# Patient Record
Sex: Female | Born: 1950 | Race: Black or African American | Hispanic: No | Marital: Single | Smoking: Current every day smoker
Health system: Southern US, Community
[De-identification: ages and names within clinical notes are randomized; demographics above are authoritative.]

## PROBLEM LIST (undated history)

## (undated) DIAGNOSIS — I10 Essential (primary) hypertension: Secondary | ICD-10-CM

## (undated) DIAGNOSIS — E785 Hyperlipidemia, unspecified: Secondary | ICD-10-CM

## (undated) HISTORY — PX: NO PAST SURGERIES: SHX2092

## (undated) HISTORY — DX: Hyperlipidemia, unspecified: E78.5

## (undated) HISTORY — DX: Essential (primary) hypertension: I10

---

## 2005-10-21 ENCOUNTER — Emergency Department: Payer: Self-pay | Admitting: Emergency Medicine

## 2006-06-07 ENCOUNTER — Emergency Department: Payer: Self-pay | Admitting: Emergency Medicine

## 2009-01-03 ENCOUNTER — Ambulatory Visit: Payer: Self-pay | Admitting: Gastroenterology

## 2019-05-23 ENCOUNTER — Encounter: Payer: Self-pay | Admitting: Family Medicine

## 2019-05-23 ENCOUNTER — Ambulatory Visit (INDEPENDENT_AMBULATORY_CARE_PROVIDER_SITE_OTHER): Payer: Medicare Other | Admitting: Family Medicine

## 2019-05-23 ENCOUNTER — Other Ambulatory Visit: Payer: Self-pay

## 2019-05-23 VITALS — BP 178/89 | HR 73 | Temp 96.9°F | Resp 18 | Ht 60.5 in | Wt 160.6 lb

## 2019-05-23 DIAGNOSIS — I10 Essential (primary) hypertension: Secondary | ICD-10-CM | POA: Insufficient documentation

## 2019-05-23 DIAGNOSIS — Z0000 Encounter for general adult medical examination without abnormal findings: Secondary | ICD-10-CM | POA: Diagnosis not present

## 2019-05-23 DIAGNOSIS — Z13820 Encounter for screening for osteoporosis: Secondary | ICD-10-CM

## 2019-05-23 DIAGNOSIS — Z7689 Persons encountering health services in other specified circumstances: Secondary | ICD-10-CM

## 2019-05-23 DIAGNOSIS — Z1239 Encounter for other screening for malignant neoplasm of breast: Secondary | ICD-10-CM | POA: Insufficient documentation

## 2019-05-23 DIAGNOSIS — Z1231 Encounter for screening mammogram for malignant neoplasm of breast: Secondary | ICD-10-CM | POA: Diagnosis not present

## 2019-05-23 DIAGNOSIS — R0683 Snoring: Secondary | ICD-10-CM

## 2019-05-23 DIAGNOSIS — M79671 Pain in right foot: Secondary | ICD-10-CM

## 2019-05-23 DIAGNOSIS — Z1211 Encounter for screening for malignant neoplasm of colon: Secondary | ICD-10-CM

## 2019-05-23 DIAGNOSIS — Z720 Tobacco use: Secondary | ICD-10-CM

## 2019-05-23 DIAGNOSIS — Z122 Encounter for screening for malignant neoplasm of respiratory organs: Secondary | ICD-10-CM

## 2019-05-23 LAB — POCT URINALYSIS DIPSTICK
Bilirubin, UA: NEGATIVE
Blood, UA: NEGATIVE
Glucose, UA: NEGATIVE
Ketones, UA: NEGATIVE
Leukocytes, UA: NEGATIVE
Nitrite, UA: NEGATIVE
Protein, UA: NEGATIVE
Spec Grav, UA: >=1.03 — AB (ref 1.010–1.025)
Urobilinogen, UA: 0.2 E.U./dL
pH, UA: 5 (ref 5.0–8.0)

## 2019-05-23 MED ORDER — DICLOFENAC SODIUM 1 % EX GEL: 2.0000 g | Freq: Four times a day (QID) | CUTANEOUS | 1 refills | Status: AC

## 2019-05-23 MED ORDER — BLOOD PRESSURE KIT DEVI: 1.0000 [IU] | Freq: Two times a day (BID) | 0 refills | Status: AC

## 2019-05-23 MED ORDER — LOSARTAN POTASSIUM 25 MG PO TABS: 25.0000 mg | ORAL_TABLET | Freq: Every day | ORAL | 0 refills | Status: DC

## 2019-05-23 NOTE — Assessment & Plan Note (Signed)
Reports smoking history 10-20 cigarettes per day for >35 years.  No desire to discuss cessation today.

## 2019-05-23 NOTE — Progress Notes (Addendum)
Subjective:    Patient ID: Darlene Mcdaniel, female    DOB: 1951-01-30, 69 y.o.   MRN: [id number]  Darlene Mcdaniel is a 69 y.o. female presenting on 05/23/2019 for Establish Care (Swollen Rt foot w/ intermittent sharp shooting pain. Not related to any injury that she recalls. No improvement with incline or worsening swelling with prolong standing.   x 2 week) and Hypertension (elevated bp reading at her dentist appt x 10 days ago)   HPI  HEALTH MAINTENANCE:  Weight/BMI: Obese with BMI 30.85 Physical activity: No structured exercise routine Diet: Regular Seatbelt: Yes Sunscreen: Sometimes Mammogram: Due - ordered today PAP: Due - Will complete at follow up visit in 2 weeks DEXA: Due - ordered today Colon cancer screening: Due - ordered Cologuard today HIV & Hep C Screening: Declines Optometry: Regularly Dentistry: Regularly  IMMUNIZATIONS: Influenza: Due next season Tetanus: Unknown, DUE COVID: Completed series, completed with [facility] in [place], [place]  STOP BANG SCREENING Snoring: Do you snore loudly? yes/no: Yes Tired: Do you often feel tired, fatigued, sleeping during the daytime? yes/no: Yes Observed: Has anyone observed you stop breathing or choking/gasping during your sleep? yes/no: No Pressure: Do you have or being treated for high blood pressure? yes/no: Yes BMI: Greater than 35? yes/no: No Age: Older than 50? yes/no: Yes Neck Side: Greater than 17 (males)/16 (females)? yes/no: No Gender: Born as female gender? yes/no: No  Depression screen [facility] 2/9 05/23/2019  Decreased Interest 0  Down, Depressed, Hopeless 0  PHQ - 2 Score 0    History reviewed. No pertinent past medical history. History reviewed. No pertinent surgical history. Social History   Socioeconomic History  . Marital status: Single    Spouse name: Not on file  . Number of children: Not on file  . Years of education: Not on file  . Highest education level: Not on file  Occupational History  .  Not on file  Tobacco Use  . Smoking status: Current Every Day Smoker    Packs/day: 0.75    Years: 35.00    Pack years: 26.25    Types: Cigarettes  . Smokeless tobacco: Never Used  Substance and Sexual Activity  . Alcohol use: Yes    Alcohol/week: 3.0 standard drinks    Types: 3 Cans of beer per week    Comment: 3-5  - only on the weekend  . Drug use: Never  . Sexual activity: Not on file  Other Topics Concern  . Not on file  Social History Narrative  . Not on file   Social Determinants of Health   Financial Resource Strain:   . Difficulty of Paying Living Expenses:   Food Insecurity:   . Worried About Charity fundraiser in the Last Year:   . Arboriculturist in the Last Year:   Transportation Needs:   . Film/video editor (Medical):   Marland Kitchen Lack of Transportation (Non-Medical):   Physical Activity:   . Days of Exercise per Week:   . Minutes of Exercise per Session:   Stress:   . Feeling of Stress :   Social Connections:   . Frequency of Communication with Friends and Family:   . Frequency of Social Gatherings with Friends and Family:   . Attends Religious Services:   . Active Member of Clubs or Organizations:   . Attends Archivist Meetings:   Marland Kitchen Marital Status:   Intimate Partner Violence:   . Fear of Current or Ex-Partner:   .  Emotionally Abused:   Marland Kitchen Physically Abused:   . Sexually Abused:    Family History  Problem Relation Age of Onset  . Heart disease Mother   . Stroke Mother   . Diabetes Mother   . Cancer Mother   . Leukemia Mother   . Breast cancer Sister    No current outpatient medications on file prior to visit.   No current facility-administered medications on file prior to visit.    Per HPI unless specifically indicated above     Objective:    BP (!) 178/89 (BP Location: Right Arm, Patient Position: Sitting, Cuff Size: Normal)   Pulse 73   Temp (!) 96.9 F (36.1 C) (Temporal)   Resp 18   Ht 5' 0.5" (1.537 m)   Wt 160 lb 9.6  oz (72.8 kg)   SpO2 98%   BMI 30.85 kg/m   Wt Readings from Last 3 Encounters:  06/07/19 162 lb 3.2 oz (73.6 kg)  05/23/19 160 lb 9.6 oz (72.8 kg)   Physical Exam Vitals reviewed.  Constitutional:      General: She is not in acute distress.    Appearance: Normal appearance. She is well-developed, well-groomed and overweight. She is not ill-appearing or toxic-appearing.  HENT:     Head: Normocephalic.     Right Ear: Tympanic membrane, ear canal and external ear normal. There is no impacted cerumen.     Left Ear: Tympanic membrane, ear canal and external ear normal. There is no impacted cerumen.     Nose: Nose normal. No congestion or rhinorrhea.     Mouth/Throat:     Lips: Pink.     Mouth: Mucous membranes are moist.     Pharynx: Oropharynx is clear. Uvula midline. No oropharyngeal exudate or posterior oropharyngeal erythema.  Eyes:     General: Lids are normal. Vision grossly intact. No scleral icterus.       Right eye: No discharge.        Left eye: No discharge.     Extraocular Movements: Extraocular movements intact.     Conjunctiva/sclera: Conjunctivae normal.     Pupils: Pupils are equal, round, and reactive to light.  Neck:     Thyroid: No thyroid mass or thyromegaly.  Cardiovascular:     Rate and Rhythm: Normal rate and regular rhythm.     Pulses: Normal pulses.          Dorsalis pedis pulses are 2+ on the right side and 2+ on the left side.     Heart sounds: Normal heart sounds. No murmur. No friction rub. No gallop.   Pulmonary:     Effort: Pulmonary effort is normal. No respiratory distress.     Breath sounds: Normal breath sounds.  Abdominal:     General: Abdomen is flat. Bowel sounds are normal. There is no distension.     Palpations: Abdomen is soft. There is no hepatomegaly, splenomegaly or mass.     Tenderness: There is no abdominal tenderness. There is no guarding or rebound.     Hernia: No hernia is present.  Musculoskeletal:        General: Swelling and  tenderness present. Normal range of motion.     Cervical back: Normal range of motion and neck supple. No tenderness.     Right lower leg: No edema.     Left lower leg: No edema.     Right ankle: Normal.     Left ankle: Swelling present. No deformity, ecchymosis or lacerations. Tenderness  present over the lateral malleolus. Normal range of motion.  Feet:     Right foot:     Skin integrity: Skin integrity normal.     Left foot:     Skin integrity: Skin integrity normal.  Lymphadenopathy:     Cervical: No cervical adenopathy.  Skin:    General: Skin is warm and dry.     Capillary Refill: Capillary refill takes less than 2 seconds.  Neurological:     General: No focal deficit present.     Mental Status: She is alert and oriented to person, place, and time.     Cranial Nerves: No cranial nerve deficit.     Sensory: No sensory deficit.     Motor: No weakness.     Coordination: Coordination normal.     Gait: Gait normal.     Deep Tendon Reflexes: Reflexes normal.  Psychiatric:        Attention and Perception: Attention and perception normal.        Mood and Affect: Mood and affect normal.        Speech: Speech normal.        Behavior: Behavior normal. Behavior is cooperative.        Thought Content: Thought content normal.        Cognition and Memory: Cognition and memory normal.        Judgment: Judgment normal.     Results for orders placed or performed in visit on 05/23/19  CBC with Differential  Result Value Ref Range   WBC 8.6 3.8 - 10.8 Thousand/uL   RBC 4.57 3.80 - 5.10 Million/uL   Hemoglobin 14.3 11.7 - 15.5 g/dL   HCT 43.2 35.0 - 45.0 %   MCV 94.5 80.0 - 100.0 fL   MCH 31.3 27.0 - 33.0 pg   MCHC 33.1 32.0 - 36.0 g/dL   RDW 13.4 11.0 - 15.0 %   Platelets 288 140 - 400 Thousand/uL   MPV 11.3 7.5 - 12.5 fL   Neutro Abs 5,788 1,500 - 7,800 cells/uL   Lymphs Abs 2,133 850 - 3,900 cells/uL   Absolute Monocytes 576 200 - 950 cells/uL   Eosinophils Absolute 52 15 - 500  cells/uL   Basophils Absolute 52 0 - 200 cells/uL   Neutrophils Relative % 67.3 %   Total Lymphocyte 24.8 %   Monocytes Relative 6.7 %   Eosinophils Relative 0.6 %   Basophils Relative 0.6 %  COMPLETE METABOLIC PANEL WITH GFR  Result Value Ref Range   Glucose, Bld 88 65 - 139 mg/dL   BUN 13 7 - 25 mg/dL   Creat 0.55 0.50 - 0.99 mg/dL   GFR, Est Non African American 98 > OR = 60 mL/min/1.40m   GFR, Est African American 113 > OR = 60 mL/min/1.714m  BUN/Creatinine Ratio NOT APPLICABLE 6 - 22 (calc)   Sodium 142 135 - 146 mmol/L   Potassium 4.0 3.5 - 5.3 mmol/L   Chloride 106 98 - 110 mmol/L   CO2 28 20 - 32 mmol/L   Calcium 9.1 8.6 - 10.4 mg/dL   Total Protein 7.3 6.1 - 8.1 g/dL   Albumin 4.1 3.6 - 5.1 g/dL   Globulin 3.2 1.9 - 3.7 g/dL (calc)   AG Ratio 1.3 1.0 - 2.5 (calc)   Total Bilirubin 0.5 0.2 - 1.2 mg/dL   Alkaline phosphatase (APISO) 67 37 - 153 U/L   AST 16 10 - 35 U/L   ALT 18 6 - 29 U/L  Thyroid Panel With TSH  Result Value Ref Range   T3 Uptake 32 22 - 35 %   T4, Total 6.9 5.1 - 11.9 mcg/dL   Free Thyroxine Index 2.2 1.4 - 3.8   TSH 0.57 0.40 - 4.50 mIU/L  POCT Urinalysis Dipstick  Result Value Ref Range   Color, UA Yellow    Clarity, UA Clear    Glucose, UA Negative Negative   Bilirubin, UA Negative    Ketones, UA negative    Spec Grav, UA >=1.030 (A) 1.010 - 1.025   Blood, UA negative    pH, UA 5.0 5.0 - 8.0   Protein, UA Negative Negative   Urobilinogen, UA 0.2 0.2 or 1.0 E.U./dL   Nitrite, UA negative    Leukocytes, UA Negative Negative   Appearance     Odor        Assessment & Plan:   Problem List Items Addressed This Visit      Cardiovascular and Mediastinum   Hypertension - Primary    Uncontrolled hypertension.  BP is not at goal < 130/80.  Pt will begin working on lifestyle modifications.  No known complications.  Without primary care > 10 years.  ED visit 11/15/2016 with similar blood pressure readings.  Discussed importance of obtaining  blood pressure control.  Plan: 1. BEGIN taking Losartan '25mg'$  daily 2. Obtain labs today 3. Encouraged heart healthy diet and increasing exercise to 30 minutes most days of the week, going no more than 2 days in a row without exercise. 4. Check BP 1-2 x per day at home, keep log, and bring to clinic at next appointment. 5. Follow up 2 weeks.      Relevant Medications   Blood Pressure Monitoring (BLOOD PRESSURE KIT) DEVI   Other Relevant Orders   CBC with Differential (Completed)   COMPLETE METABOLIC PANEL WITH GFR (Completed)     Other   Right foot pain    Chronic right foot pain with swelling over lateral malleolus with fluctuance.  No s/s of infectious process and similar findings at ER visit 11/15/2016.  Has not had evaluated with Orthopedic in the past.  Reports unknown if swelling gets better when feet are up.  No left lower extremity swelling.  No redness, warmth, streaking, fever, SOB, CP, or s/s of DVT.    Plan: 1. Wear compression stockings daily, taking off at night for bed 2. Can use topical diclofenac gel 1% daily 3-4x per day as needed for pain 3. Follow up in 2 weeks and if no change in symptoms, will refer to Orthopedics.      Relevant Medications   diclofenac Sodium (VOLTAREN) 1 % GEL   Tobacco abuse    Reports smoking history 10-20 cigarettes per day for >35 years.  No desire to discuss cessation today.      Relevant Orders   Ambulatory Referral for Lung Cancer Scre   Snoring    Patient reports waking up not feeling rested with 4/8 scoring on STOP-BANG Questionnaire.  Reports snores, does wake herself up with her snoring.  Denies any witnessed apneas.  Has not been diagnosed with OSA or had a sleep study in the past.  Plan: 1. Sleep study ordered      Relevant Orders   Nocturnal polysomnography (NPSG)   Encounter to establish care with new doctor    Patient without reported primary care > 10 years.    Labs ordered and to be completed today. Lipid labs to  be completed in  2 weeks at follow up appointment when fasting.  Return to clinic in 2 weeks for hypertension and right foot pain follow up as well as PAP testing.      Screening for malignant neoplasm of colon    Pt requiring colon cancer screening.  No known family history of colon cancer.  Plan: - Discussed timing for initiation of colon cancer screening ACS vs USPSTF guidelines - Mutual decision making discussion for options of colonoscopy vs cologuard.  Pt prefers cologuard. - Ordered Cologuard today       Relevant Orders   Cologuard   Screening for malignant neoplasm of breast    Pt last mammogram >10 years ago.  Result unknown.  No results to review.  Plan: 1. Screening mammogram order placed.  Pt will call to schedule appointment.  Information given.       Relevant Orders   MM 3D SCREEN BREAST BILATERAL   Screening for osteoporosis    Pt postmenopausal w/out history of prior DEXA scan.    Plan: 1. Obtain DG bone density.         Relevant Orders   DG Bone Density   Encounter for screening for lung cancer    Lung cancer screening referral placed for evaluation if candidate for LDCT screening.      Relevant Orders   Ambulatory Referral for Lung Cancer Scre    Other Visit Diagnoses    Routine medical exam       Relevant Orders   CBC with Differential (Completed)   COMPLETE METABOLIC PANEL WITH GFR (Completed)   Thyroid Panel With TSH (Completed)   POCT Urinalysis Dipstick (Completed)      Meds ordered this encounter  Medications  . Blood Pressure Monitoring (BLOOD PRESSURE KIT) DEVI    Sig: 1 Units by Does not apply route 2 (two) times daily.    Dispense:  1 each    Refill:  0  . DISCONTD: losartan (COZAAR) 25 MG tablet    Sig: Take 1 tablet (25 mg total) by mouth daily.    Dispense:  30 tablet    Refill:  0  . diclofenac Sodium (VOLTAREN) 1 % GEL    Sig: Apply 2 g topically 4 (four) times daily.    Dispense:  50 g    Refill:  1      Follow up  plan: Return in about 2 weeks (around 06/06/2019) for HTN, Right foot follow up and PAP testing.  Harlin Rain, FNP-C Family Nurse Practitioner [facility] 06/07/2019, 11:02 AM

## 2019-05-23 NOTE — Assessment & Plan Note (Signed)
Pt requiring colon cancer screening.  No known family history of colon cancer.  Plan: - Discussed timing for initiation of colon cancer screening ACS vs USPSTF guidelines - Mutual decision making discussion for options of colonoscopy vs cologuard.  Pt prefers cologuard. - Ordered Cologuard today

## 2019-05-23 NOTE — Assessment & Plan Note (Signed)
Chronic right foot pain with swelling over lateral malleolus with fluctuance.  No s/s of infectious process and similar findings at ER visit 11/15/2016.  Has not had evaluated with Orthopedic in the past.  Reports unknown if swelling gets better when feet are up.  No left lower extremity swelling.  No redness, warmth, streaking, fever, SOB, CP, or s/s of DVT.    Plan: 1. Wear compression stockings daily, taking off at night for bed 2. Can use topical diclofenac gel 1% daily 3-4x per day as needed for pain 3. Follow up in 2 weeks and if no change in symptoms, will refer to Orthopedics.

## 2019-05-23 NOTE — Assessment & Plan Note (Signed)
Uncontrolled hypertension.  BP is not at goal < 130/80.  Pt will begin working on lifestyle modifications.  No known complications.  Without primary care > 10 years.  ED visit 11/15/2016 with similar blood pressure readings.  Discussed importance of obtaining blood pressure control.  Plan: 1. BEGIN taking Losartan 25mg  daily 2. Obtain labs today 3. Encouraged heart healthy diet and increasing exercise to 30 minutes most days of the week, going no more than 2 days in a row without exercise. 4. Check BP 1-2 x per day at home, keep log, and bring to clinic at next appointment. 5. Follow up 2 weeks.

## 2019-05-23 NOTE — Assessment & Plan Note (Signed)
Pt postmenopausal w/out history of prior DEXA scan.    Plan: 1. Obtain DG bone density.    

## 2019-05-23 NOTE — Assessment & Plan Note (Signed)
Lung cancer screening referral placed for evaluation if candidate for LDCT screening.

## 2019-05-23 NOTE — Patient Instructions (Addendum)
As we discussed, have your labs drawn today and we will contact you with the results.  We will see you back in 2 weeks for a follow up on your right ankle pain and hypertension.  I have sent in a prescription for Losartan 25mg  to take 1 tablet daily.  Be sure to change positions slowly for the next few days as while this medication is lowering your blood pressure you may have some dizziness, lightheadedness with position changes.  Can use topical diclofenac gel 1% on your right foot 3-4x per day as needed for discomfort.  Can wear compression stockings as well to help with the swelling.  If symptoms persist in 2 weeks, will refer you to Orthopedics for additional evaluation and treatment.  Try to get exercise a minimum of 30 minutes per day at least 5 days per week as well as  adequate water intake all while measuring blood pressure a few times per week.  Keep a blood pressure log and bring back to clinic at your next visit.  If your readings are consistently over 140/90 to contact our office/send me a MyChart message and we will see you sooner.  Can try DASH and Mediterranean diet options, avoiding processed foods, lowering sodium intake, avoiding pork products, and eating a plant based diet for optimal health.  Education and discussion with patient regarding hypertension as well as the effects on the organs and body.  Specifically, we spoke about kidney disease, kidney failure, heart attack, stroke and up to and including death, as likely outcomes if non-compliant with blood pressure regulation.  Discussed how all of these habits are attached to each other and each has the effect on each other.      Mediterranean Diet  Why follow it? Research shows. . Those who follow the Mediterranean diet have a reduced risk of heart disease  . The diet is associated with a reduced incidence of Parkinson's and Alzheimer's diseases . People following the diet may have longer life expectancies and lower rates of  chronic diseases  . The Dietary Guidelines for Americans recommends the Mediterranean diet as an eating plan to promote health and prevent disease  What Is the Mediterranean Diet?  . Healthy eating plan based on typical foods and recipes of Mediterranean-style cooking . The diet is primarily a plant based diet; these foods should make up a majority of meals   Starches - Plant based foods should make up a majority of meals - They are an important sources of vitamins, minerals, energy, antioxidants, and fiber - Choose whole grains, foods high in fiber and minimally processed items  - Typical grain sources include wheat, oats, barley, corn, brown rice, bulgar, farro, millet, polenta, couscous  - Various types of beans include chickpeas, lentils, fava beans, black beans, white beans   Fruits  Veggies - Large quantities of antioxidant rich fruits & veggies; 6 or more servings  - Vegetables can be eaten raw or lightly drizzled with oil and cooked  - Vegetables common to the traditional Mediterranean Diet include: artichokes, arugula, beets, broccoli, brussel sprouts, cabbage, carrots, celery, collard greens, cucumbers, eggplant, kale, leeks, lemons, lettuce, mushrooms, okra, onions, peas, peppers, potatoes, pumpkin, radishes, rutabaga, shallots, spinach, sweet potatoes, turnips, zucchini - Fruits common to the Mediterranean Diet include: apples, apricots, avocados, cherries, clementines, dates, figs, grapefruits, grapes, melons, nectarines, oranges, peaches, pears, pomegranates, strawberries, tangerines  Fats - Replace butter and margarine with healthy oils, such as olive oil, canola oil, and tahini  - Limit  nuts to no more than a handful a day  - Nuts include walnuts, almonds, pecans, pistachios, pine nuts  - Limit or avoid candied, honey roasted or heavily salted nuts - Olives are central to the Mediterranean diet - can be eaten whole or used in a variety of dishes   Meats Protein - Limiting red  meat: no more than a few times a month - When eating red meat: choose lean cuts and keep the portion to the size of deck of cards - Eggs: approx. 0 to 4 times a week  - Fish and lean poultry: at least 2 a week  - Healthy protein sources include, chicken, [place], lean beef, lamb - Increase intake of seafood such as tuna, salmon, trout, mackerel, shrimp, scallops - Avoid or limit high fat processed meats such as sausage and bacon  Dairy - Include moderate amounts of low fat dairy products  - Focus on healthy dairy such as fat free yogurt, skim milk, low or reduced fat cheese - Limit dairy products higher in fat such as whole or 2% milk, cheese, ice cream  Alcohol - Moderate amounts of red wine is ok  - No more than 5 oz daily for women (all ages) and men older than age 31  - No more than 10 oz of wine daily for men younger than 75  Other - Limit sweets and other desserts  - Use herbs and spices instead of salt to flavor foods  - Herbs and spices common to the traditional Mediterranean Diet include: basil, bay leaves, chives, cloves, cumin, fennel, garlic, lavender, marjoram, mint, oregano, parsley, pepper, rosemary, sage, savory, sumac, tarragon, thyme   It's not just a diet, it's a lifestyle:  . The Mediterranean diet includes lifestyle factors typical of those in the region  . Foods, drinks and meals are best eaten with others and savored . Daily physical activity is important for overall good health . This could be strenuous exercise like running and aerobics . This could also be more leisurely activities such as walking, housework, yard-work, or taking the stairs . Moderation is the key; a balanced and healthy diet accommodates most foods and drinks . Consider portion sizes and frequency of consumption of certain foods   Meal Ideas & Options:  . Breakfast:  o Whole wheat toast or whole wheat English muffins with peanut butter & hard boiled egg o Steel cut oats topped with apples &  cinnamon and skim milk  o Fresh fruit: banana, strawberries, melon, berries, peaches  o Smoothies: strawberries, bananas, greek yogurt, peanut butter o Low fat greek yogurt with blueberries and granola  o Egg white omelet with spinach and mushrooms o Breakfast couscous: whole wheat couscous, apricots, skim milk, cranberries  . Sandwiches:  o Hummus and grilled vegetables (peppers, zucchini, squash) on whole wheat bread   o Grilled chicken on whole wheat pita with lettuce, tomatoes, cucumbers or tzatziki  o Tuna salad on whole wheat bread: tuna salad made with greek yogurt, olives, red peppers, capers, green onions o Garlic rosemary lamb pita: lamb sauted with garlic, rosemary, salt & pepper; add lettuce, cucumber, greek yogurt to pita - flavor with lemon juice and black pepper  . Seafood:  o Mediterranean grilled salmon, seasoned with garlic, basil, parsley, lemon juice and black pepper o Shrimp, lemon, and spinach whole-grain pasta salad made with low fat greek yogurt  o Seared scallops with lemon orzo  o Seared tuna steaks seasoned salt, pepper, coriander topped with tomato mixture  of olives, tomatoes, olive oil, minced garlic, parsley, green onions and cappers  . Meats:  o Herbed greek chicken salad with kalamata olives, cucumber, feta  o Red bell peppers stuffed with spinach, bulgur, lean ground beef (or lentils) & topped with feta   o Kebabs: skewers of chicken, tomatoes, onions, zucchini, squash  o [place] burgers: made with red onions, mint, dill, lemon juice, feta cheese topped with roasted red peppers . Vegetarian o Cucumber salad: cucumbers, artichoke hearts, celery, red onion, feta cheese, tossed in olive oil & lemon juice  o Hummus and whole grain pita points with a greek salad (lettuce, tomato, feta, olives, cucumbers, red onion) o Lentil soup with celery, carrots made with vegetable broth, garlic, salt and pepper  o Tabouli salad: parsley, bulgur, mint, scallions, cucumbers,  tomato, radishes, lemon juice, olive oil, salt and pepper.  For Mammogram screening for breast cancer and DEXA Scan (Bone mineral density) screening for osteoporosis  Call the Imaging Center below anytime to schedule your own appointment now that order has been placed.  [facility] [address] [place], [place] [address] Phone: [phone]  [facility] [address] [place], [place] [address] Phone: [phone]  I have sent in a referral for a sleep study and for your Cologuard testing.  You will receive a survey after today's visit either digitally by e-mail or paper by [employer]. Your experiences and feedback matter to [place].  Please respond so we know how we are doing as we provide care for you.  Call us with any questions/concerns/needs.  It is my goal to be available to you for your health concerns.  Thanks for choosing me to be a partner in your healthcare needs!  Charlaine Dalton, FNP-C Family Nurse Practitioner [facility] Phone: [phone]

## 2019-05-23 NOTE — Assessment & Plan Note (Signed)
Patient reports waking up not feeling rested with 4/8 scoring on STOP-BANG Questionnaire.  Reports snores, does wake herself up with her snoring.  Denies any witnessed apneas.  Has not been diagnosed with OSA or had a sleep study in the past.  Plan: 1. Sleep study ordered

## 2019-05-23 NOTE — Assessment & Plan Note (Signed)
Patient without reported primary care > 10 years.    Labs ordered and to be completed today. Lipid labs to be completed in 2 weeks at follow up appointment when fasting.  Return to clinic in 2 weeks for hypertension and right foot pain follow up as well as PAP testing.

## 2019-05-23 NOTE — Assessment & Plan Note (Signed)
Pt last mammogram >10 years ago.  Result unknown.  No results to review.  Plan: 1. Screening mammogram order placed.  Pt will call to schedule appointment.  Information given.

## 2019-05-24 LAB — COMPLETE METABOLIC PANEL WITH GFR
AG Ratio: 1.3 (calc) (ref 1.0–2.5)
ALT: 18 U/L (ref 6–29)
AST: 16 U/L (ref 10–35)
Albumin: 4.1 g/dL (ref 3.6–5.1)
Alkaline phosphatase (APISO): 67 U/L (ref 37–153)
BUN: 13 mg/dL (ref 7–25)
CO2: 28 mmol/L (ref 20–32)
Calcium: 9.1 mg/dL (ref 8.6–10.4)
Chloride: 106 mmol/L (ref 98–110)
Creat: 0.55 mg/dL (ref 0.50–0.99)
GFR, Est African American: 113 mL/min/{1.73_m2} (ref >=60)
GFR, Est Non African American: 98 mL/min/{1.73_m2} (ref >=60)
Globulin: 3.2 g/dL (calc) (ref 1.9–3.7)
Glucose, Bld: 88 mg/dL (ref 65–139)
Potassium: 4 mmol/L (ref 3.5–5.3)
Sodium: 142 mmol/L (ref 135–146)
Total Bilirubin: 0.5 mg/dL (ref 0.2–1.2)
Total Protein: 7.3 g/dL (ref 6.1–8.1)

## 2019-05-24 LAB — CBC WITH DIFFERENTIAL/PLATELET
Absolute Monocytes: 576 cells/uL (ref 200–950)
Basophils Absolute: 52 cells/uL (ref 0–200)
Basophils Relative: 0.6 %
Eosinophils Absolute: 52 cells/uL (ref 15–500)
Eosinophils Relative: 0.6 %
HCT: 43.2 % (ref 35.0–45.0)
Hemoglobin: 14.3 g/dL (ref 11.7–15.5)
Lymphs Abs: 2133 cells/uL (ref 850–3900)
MCH: 31.3 pg (ref 27.0–33.0)
MCHC: 33.1 g/dL (ref 32.0–36.0)
MCV: 94.5 fL (ref 80.0–100.0)
MPV: 11.3 fL (ref 7.5–12.5)
Monocytes Relative: 6.7 %
Neutro Abs: 5788 cells/uL (ref 1500–7800)
Neutrophils Relative %: 67.3 %
Platelets: 288 10*3/uL (ref 140–400)
RBC: 4.57 10*6/uL (ref 3.80–5.10)
RDW: 13.4 % (ref 11.0–15.0)
Total Lymphocyte: 24.8 %
WBC: 8.6 10*3/uL (ref 3.8–10.8)

## 2019-05-24 LAB — THYROID PANEL WITH TSH
Free Thyroxine Index: 2.2 (ref 1.4–3.8)
T3 Uptake: 32 % (ref 22–35)
T4, Total: 6.9 ug/dL (ref 5.1–11.9)
TSH: 0.57 mIU/L (ref 0.40–4.50)

## 2019-05-25 ENCOUNTER — Telehealth: Payer: Self-pay | Admitting: *Deleted

## 2019-05-25 DIAGNOSIS — Z87891 Personal history of nicotine dependence: Secondary | ICD-10-CM

## 2019-05-25 NOTE — Telephone Encounter (Signed)
Received referral for initial lung cancer screening scan. Contacted patient and obtained smoking history,(current, 46 pack year) as well as answering questions related to screening process. Patient denies signs of lung cancer such as weight loss or hemoptysis. Patient denies comorbidity that would prevent curative treatment if lung cancer were found. Patient is scheduled for shared decision making visit and CT scan on (date TBD due to scheduling request.

## 2019-05-26 ENCOUNTER — Other Ambulatory Visit: Payer: Self-pay | Admitting: Family Medicine

## 2019-05-26 DIAGNOSIS — R0683 Snoring: Secondary | ICD-10-CM

## 2019-05-26 NOTE — Progress Notes (Signed)
Sleep study order updated to correct location. 

## 2019-06-05 ENCOUNTER — Telehealth: Payer: Self-pay

## 2019-06-05 NOTE — Telephone Encounter (Signed)
Patient informed of low dose lung cancer screening CT scan appointment on 06/09/19 @ 9:00

## 2019-06-07 ENCOUNTER — Other Ambulatory Visit (HOSPITAL_COMMUNITY)
Admission: RE | Admit: 2019-06-07 | Discharge: 2019-06-07 | Disposition: A | Discharge status: Home / Self Care | Payer: Medicare Other | Source: Ambulatory Visit | Attending: Family Medicine | Admitting: Family Medicine

## 2019-06-07 ENCOUNTER — Ambulatory Visit (INDEPENDENT_AMBULATORY_CARE_PROVIDER_SITE_OTHER): Payer: Medicare Other | Admitting: Family Medicine

## 2019-06-07 ENCOUNTER — Other Ambulatory Visit: Payer: Self-pay

## 2019-06-07 ENCOUNTER — Encounter: Payer: Self-pay | Admitting: Family Medicine

## 2019-06-07 VITALS — BP 155/75 | HR 55 | Temp 97.1°F | Ht 60.0 in | Wt 162.2 lb

## 2019-06-07 DIAGNOSIS — Z124 Encounter for screening for malignant neoplasm of cervix: Secondary | ICD-10-CM

## 2019-06-07 DIAGNOSIS — Z23 Encounter for immunization: Secondary | ICD-10-CM | POA: Diagnosis not present

## 2019-06-07 DIAGNOSIS — I10 Essential (primary) hypertension: Secondary | ICD-10-CM

## 2019-06-07 DIAGNOSIS — M79671 Pain in right foot: Secondary | ICD-10-CM

## 2019-06-07 DIAGNOSIS — E669 Obesity, unspecified: Secondary | ICD-10-CM

## 2019-06-07 DIAGNOSIS — Z13220 Encounter for screening for lipoid disorders: Secondary | ICD-10-CM

## 2019-06-07 DIAGNOSIS — Z01419 Encounter for gynecological examination (general) (routine) without abnormal findings: Secondary | ICD-10-CM | POA: Insufficient documentation

## 2019-06-07 MED ORDER — LOSARTAN POTASSIUM-HCTZ 50-12.5 MG PO TABS: 1.0000 | ORAL_TABLET | Freq: Every day | ORAL | 0 refills | Status: DC

## 2019-06-07 NOTE — Assessment & Plan Note (Signed)
Uncontrolled hypertension.  BP is not at goal < 130/80.  Pt is working on lifestyle modifications.  Taking medications tolerating well without side effects. No known complications.  Blood pressure log reviewed in clinic with patient, showing BP readings ranging from 150-190/80-115.  Reports has been asymptomatic with all readings.  Discussed importance of blood pressure control and will increase medications today.  Plan: 1. STOP taking Losartan 25mg  and BEGIN taking Losartan-Hydrochlorothiazide 50-12.5mg  2. Obtain lipid labs at next office visit in 2 weeks  3. Encouraged heart healthy diet and increasing exercise to 30 minutes most days of the week, going no more than 2 days in a row without exercise. 4. Check BP 1-2 x per day at home, keep log, and bring to clinic at next appointment. 5. Follow up 2 weeks.

## 2019-06-07 NOTE — Progress Notes (Signed)
Subjective:    Patient ID: Darlene Mcdaniel, female    DOB: 03-22-1950, 69 y.o.   MRN: [id number]  Darlene Mcdaniel is a 69 y.o. female presenting on 06/07/2019 for Hypertension and Foot Swelling (chronic right foot swelling Chronic right foot pain with swelling over lateral malleolus with fluctuance. Pt state she has not notice much improvement with wearing the compression socks.)   HPI  Hypertension - She is checking BP at home or outside of clinic.  Readings 150-190/80-115 - Current medications: losartan 25mg  daily, tolerating well without side effects - She is not currently symptomatic. - Pt denies headache, lightheadedness, dizziness, changes in vision, chest tightness/pressure, palpitations, leg swelling, sudden loss of speech or loss of consciousness. - She  reports no regular exercise routine. - Her diet is high in salt, high in fat, and high in carbohydrates.  Reports she has been having continued right foot pain.  Has tried rest, ice, elevation and compression along with topical diclofenac gel without change in her symptoms.  Current exacerbation has been going on x 4 weeks but reviewed chart and has similar concerns from the ER on 11/15/2016.  Depression screen PHQ 2/9 05/23/2019  Decreased Interest 0  Down, Depressed, Hopeless 0  PHQ - 2 Score 0    Social History   Tobacco Use  . Smoking status: Current Every Day Smoker    Packs/day: 0.75    Years: 35.00    Pack years: 26.25    Types: Cigarettes  . Smokeless tobacco: Never Used  Substance Use Topics  . Alcohol use: Yes    Alcohol/week: 3.0 standard drinks    Types: 3 Cans of beer per week    Comment: 3-5  - only on the weekend  . Drug use: Never    Review of Systems  Constitutional: Negative.   HENT: Negative.   Eyes: Negative.   Respiratory: Negative.   Cardiovascular: Negative.   Gastrointestinal: Negative.   Endocrine: Negative.   Genitourinary: Negative.   Musculoskeletal: Positive for arthralgias and  joint swelling. Negative for back pain, gait problem, neck pain and neck stiffness.  Skin: Negative.   Allergic/Immunologic: Negative.   Neurological: Negative.   Hematological: Negative.   Psychiatric/Behavioral: Negative.    Per HPI unless specifically indicated above     Objective:    BP (!) 155/75 (BP Location: Left Arm, Patient Position: Sitting, Cuff Size: Normal)   Pulse (!) 55   Temp (!) 97.1 F (36.2 C) (Temporal)   Ht 5' (1.524 m)   Wt 162 lb 3.2 oz (73.6 kg)   BMI 31.68 kg/m   Wt Readings from Last 3 Encounters:  06/07/19 162 lb 3.2 oz (73.6 kg)  05/23/19 160 lb 9.6 oz (72.8 kg)    Physical Exam Vitals reviewed.  Constitutional:      General: She is not in acute distress.    Appearance: Normal appearance. She is well-developed and well-groomed. She is obese. She is not ill-appearing or toxic-appearing.  HENT:     Head: Normocephalic.  Eyes:     General: Lids are normal. Vision grossly intact.        Right eye: No discharge.        Left eye: No discharge.     Pupils: Pupils are equal, round, and reactive to light.  Cardiovascular:     Pulses: Normal pulses.          Dorsalis pedis pulses are 2+ on the right side and 2+ on the left side.  Pulmonary:  Effort: Pulmonary effort is normal.  Abdominal:     General: Abdomen is flat. There is no distension.     Palpations: Abdomen is soft.     Tenderness: There is no abdominal tenderness.     Hernia: There is no hernia in the left inguinal area or right inguinal area.  Genitourinary:    General: Normal vulva.     Exam position: Lithotomy position.     Pubic Area: No rash or pubic lice.      Labia:        Right: No rash, tenderness, lesion or injury.        Left: No rash, tenderness, lesion or injury.      Urethra: No urethral pain, urethral swelling or urethral lesion.     Vagina: Normal.     Cervix: Normal.     Uterus: Normal.      Adnexa: Right adnexa normal and left adnexa normal.  Musculoskeletal:         General: Swelling and tenderness present. Normal range of motion.     Right lower leg: No edema.     Left lower leg: No edema.     Right ankle: Normal.     Left ankle: Swelling present. No deformity, ecchymosis or lacerations. Tenderness present over the lateral malleolus. Normal range of motion.     Left foot: Tenderness present.  Feet:     Right foot:     Skin integrity: Skin integrity normal.     Left foot:     Skin integrity: Skin integrity normal.  Lymphadenopathy:     Lower Body: No right inguinal adenopathy. No left inguinal adenopathy.  Skin:    General: Skin is warm and dry.     Capillary Refill: Capillary refill takes less than 2 seconds.  Neurological:     General: No focal deficit present.     Mental Status: She is alert and oriented to person, place, and time.     Cranial Nerves: No cranial nerve deficit.     Sensory: No sensory deficit.     Motor: No weakness.     Coordination: Coordination normal.     Gait: Gait normal.  Psychiatric:        Mood and Affect: Mood normal.        Behavior: Behavior normal. Behavior is cooperative.        Thought Content: Thought content normal.        Judgment: Judgment normal.     Results for orders placed or performed in visit on 05/23/19  CBC with Differential  Result Value Ref Range   WBC 8.6 3.8 - 10.8 Thousand/uL   RBC 4.57 3.80 - 5.10 Million/uL   Hemoglobin 14.3 11.7 - 15.5 g/dL   HCT 51.0 25.8 - 52.7 %   MCV 94.5 80.0 - 100.0 fL   MCH 31.3 27.0 - 33.0 pg   MCHC 33.1 32.0 - 36.0 g/dL   RDW 78.2 42.3 - 53.6 %   Platelets 288 140 - 400 Thousand/uL   MPV 11.3 7.5 - 12.5 fL   Neutro Abs 5,788 1,500 - 7,800 cells/uL   Lymphs Abs 2,133 850 - 3,900 cells/uL   Absolute Monocytes 576 200 - 950 cells/uL   Eosinophils Absolute 52 15 - 500 cells/uL   Basophils Absolute 52 0 - 200 cells/uL   Neutrophils Relative % 67.3 %   Total Lymphocyte 24.8 %   Monocytes Relative 6.7 %   Eosinophils Relative 0.6 %   Basophils  Relative 0.6 %  COMPLETE METABOLIC PANEL WITH GFR  Result Value Ref Range   Glucose, Bld 88 65 - 139 mg/dL   BUN 13 7 - 25 mg/dL   Creat 0.55 0.50 - 0.99 mg/dL   GFR, Est Non African American 98 > OR = 60 mL/min/1.67m2   GFR, Est African American 113 > OR = 60 mL/min/1.29m2   BUN/Creatinine Ratio NOT APPLICABLE 6 - 22 (calc)   Sodium 142 135 - 146 mmol/L   Potassium 4.0 3.5 - 5.3 mmol/L   Chloride 106 98 - 110 mmol/L   CO2 28 20 - 32 mmol/L   Calcium 9.1 8.6 - 10.4 mg/dL   Total Protein 7.3 6.1 - 8.1 g/dL   Albumin 4.1 3.6 - 5.1 g/dL   Globulin 3.2 1.9 - 3.7 g/dL (calc)   AG Ratio 1.3 1.0 - 2.5 (calc)   Total Bilirubin 0.5 0.2 - 1.2 mg/dL   Alkaline phosphatase (APISO) 67 37 - 153 U/L   AST 16 10 - 35 U/L   ALT 18 6 - 29 U/L  Thyroid Panel With TSH  Result Value Ref Range   T3 Uptake 32 22 - 35 %   T4, Total 6.9 5.1 - 11.9 mcg/dL   Free Thyroxine Index 2.2 1.4 - 3.8   TSH 0.57 0.40 - 4.50 mIU/L  POCT Urinalysis Dipstick  Result Value Ref Range   Color, UA Yellow    Clarity, UA Clear    Glucose, UA Negative Negative   Bilirubin, UA Negative    Ketones, UA negative    Spec Grav, UA >=1.030 (A) 1.010 - 1.025   Blood, UA negative    pH, UA 5.0 5.0 - 8.0   Protein, UA Negative Negative   Urobilinogen, UA 0.2 0.2 or 1.0 E.U./dL   Nitrite, UA negative    Leukocytes, UA Negative Negative   Appearance     Odor        Assessment & Plan:   Problem List Items Addressed This Visit      Cardiovascular and Mediastinum   Hypertension - Primary    Uncontrolled hypertension.  BP is not at goal < 130/80.  Pt is working on lifestyle modifications.  Taking medications tolerating well without side effects. No known complications.  Blood pressure log reviewed in clinic with patient, showing BP readings ranging from 150-190/80-115.  Reports has been asymptomatic with all readings.  Discussed importance of blood pressure control and will increase medications today.  Plan: 1. STOP taking  Losartan 25mg  and BEGIN taking Losartan-Hydrochlorothiazide 50-12.5mg  2. Obtain lipid labs at next office visit in 2 weeks  3. Encouraged heart healthy diet and increasing exercise to 30 minutes most days of the week, going no more than 2 days in a row without exercise. 4. Check BP 1-2 x per day at home, keep log, and bring to clinic at next appointment. 5. Follow up 2 weeks.       Relevant Medications   losartan-hydrochlorothiazide (HYZAAR) 50-12.5 MG tablet   Other Relevant Orders   Lipid Profile     Other   Right foot pain    Continued chronic right foot pain with swelling over lateral malleolus with fluctuance.  Has long history of chronic right foot pain, dating back to ER visit on 11/15/2016 with similar findings.  Has not yet been evaluated by Orthopedics but will send referral today.  No left lower extremity swelling, redness, warmth, streaking, fever, SOB, CP or s/s of infection or DVT.  Has  worn compression stockings and used topical diclofenac gel without relief of symptoms.  Plan: 1. Referral sent to Orthopedics 2. Can continue to elevate feet and can take over the counter ibuprofen, according to packaging directions, for increased pain      Relevant Orders   AMB referral to orthopedics   Well woman exam with routine gynecological exam    Other Visit Diagnoses    Obesity (BMI 30-39.9)       Relevant Orders   Lipid Profile   Screening for lipid disorders       Relevant Orders   Lipid Profile   Need for vaccination against Streptococcus pneumoniae using pneumococcal conjugate vaccine 13       Relevant Orders   Pneumococcal conjugate vaccine 13-valent   Screening for cervical cancer       Relevant Orders   Cytology - PAP      Meds ordered this encounter  Medications  . losartan-hydrochlorothiazide (HYZAAR) 50-12.5 MG tablet    Sig: Take 1 tablet by mouth daily.    Dispense:  30 tablet    Refill:  0      Follow up plan: Return in about 2 weeks (around  06/21/2019) for HTN F/U.   Charlaine Dalton, FNP Family Nurse Practitioner [facility] 06/07/2019, 8:42 AM

## 2019-06-07 NOTE — Patient Instructions (Addendum)
I have increased your Losartan and sent in a new prescription for Losartan-Hydroclorothiazide 50-12.5mg  to begin taking daily.  I know it is overwhelming to have a long list of things that need to be completed for your healthcare.  As we discussed, try to make a plan and complete one thing per week or so and you will begin to see the to do list get smaller over time.  To do: 1. Call and schedule Mammogram and Bone Density (See information below)  [facility] [address] [place], [place] [address] Phone: [phone]  [facility] Outpatient Radiology [address] [place], [place] [address] Phone: [phone]  2. Have your labs for cholesterol drawn in the next 1-2 weeks on a day that you have been fasting from midnight the night before.  Our lab is here Monday-Friday from 8am-12pm  3. Schedule your appointment with Orthopedics, once they call you, for evaluation of your right foot pain  4. Complete your LDCT on 06/09/2019  5. Complete your sleep study on 06/29/2019  6. Complete your Cologuard and send back to the company  7. Take your blood pressure twice per day for the next 2 weeks.  Be sure that it is at least 1 hour from when you took your medication.  Be sure to sit in a chair where your feet are flat on the ground and your arm is at the level of your heart (can rest your arm on a table or countertop).  We will plan to see you back in 2 weeks for a follow up on your hypertension  You will receive a survey after today's visit either digitally by e-mail or paper by [employer] mail. Your experiences and feedback matter to [place].  Please respond so we know how we are doing as we provide care for you.  Call us with any questions/concerns/needs.  It is my goal to be available to you for your health concerns.  Thanks for choosing me to be a partner in your healthcare needs!  Charlaine Dalton, FNP-C Family Nurse Practitioner [facility] Phone: [phone]

## 2019-06-07 NOTE — Assessment & Plan Note (Signed)
Continued chronic right foot pain with swelling over lateral malleolus with fluctuance.  Has long history of chronic right foot pain, dating back to ER visit on 11/15/2016 with similar findings.  Has not yet been evaluated by Orthopedics but will send referral today.  No left lower extremity swelling, redness, warmth, streaking, fever, SOB, CP or s/s of infection or DVT.  Has worn compression stockings and used topical diclofenac gel without relief of symptoms.  Plan: 1. Referral sent to Orthopedics 2. Can continue to elevate feet and can take over the counter ibuprofen, according to packaging directions, for increased pain

## 2019-06-08 ENCOUNTER — Inpatient Hospital Stay: Payer: Medicare Other | Attending: Oncology | Admitting: Oncology

## 2019-06-08 DIAGNOSIS — F17210 Nicotine dependence, cigarettes, uncomplicated: Secondary | ICD-10-CM

## 2019-06-08 DIAGNOSIS — Z87891 Personal history of nicotine dependence: Secondary | ICD-10-CM

## 2019-06-08 LAB — CYTOLOGY - PAP: Diagnosis: NEGATIVE

## 2019-06-08 NOTE — Progress Notes (Signed)
Virtual Visit via Video Note  I connected with  on 06/08/19 at  1:30 PM EDT by a video enabled telemedicine application and verified that I am speaking with the correct person using two identifiers.  Location: Patient: Home Provider: Office   I discussed the limitations of evaluation and management by telemedicine and the availability of in person appointments. The patient expressed understanding and agreed to proceed.  I discussed the assessment and treatment plan with the patient. The patient was provided an opportunity to ask questions and all were answered. The patient agreed with the plan and demonstrated an understanding of the instructions.   The patient was advised to call back or seek an in-person evaluation if the symptoms worsen or if the condition fails to improve as anticipated.   In accordance with CMS guidelines, patient has met eligibility criteria including age, absence of signs or symptoms of lung cancer.  Social History   Tobacco Use  . Smoking status: Current Every Day Smoker    Packs/day: 0.75    Years: 35.00    Pack years: 26.25    Types: Cigarettes  . Smokeless tobacco: Never Used  Substance Use Topics  . Alcohol use: Yes    Alcohol/week: 3.0 standard drinks    Types: 3 Cans of beer per week    Comment: 3-5  - only on the weekend  . Drug use: Never      A shared decision-making session was conducted prior to the performance of CT scan. This includes one or more decision aids, includes benefits and harms of screening, follow-up diagnostic testing, over-diagnosis, false positive rate, and total radiation exposure.   Counseling on the importance of adherence to annual lung cancer LDCT screening, impact of co-morbidities, and ability or willingness to undergo diagnosis and treatment is imperative for compliance of the program.   Counseling on the importance of continued smoking cessation for former smokers; the importance of smoking cessation for current  smokers, and information about tobacco cessation interventions have been given to patient including [place] [facility] and [facility] quit [place] programs.   Written order for lung cancer screening with LDCT has been given to the patient and any and all questions have been answered to the best of my abilities.    Yearly follow up will be coordinated by Shawn Perkins, Thoracic Navigator.  I provided 15 minutes of face-to-face video visit time during this encounter, and > 50% was spent counseling as documented under my assessment & plan.   Jennifer E Burns, NP  

## 2019-06-09 ENCOUNTER — Ambulatory Visit: Payer: Medicare Other | Attending: Oncology

## 2019-06-09 DIAGNOSIS — M1712 Unilateral primary osteoarthritis, left knee: Secondary | ICD-10-CM | POA: Diagnosis not present

## 2019-06-09 DIAGNOSIS — M79671 Pain in right foot: Secondary | ICD-10-CM | POA: Diagnosis not present

## 2019-06-14 DIAGNOSIS — Z1211 Encounter for screening for malignant neoplasm of colon: Secondary | ICD-10-CM | POA: Diagnosis not present

## 2019-06-19 ENCOUNTER — Other Ambulatory Visit: Payer: Self-pay | Admitting: Family Medicine

## 2019-06-19 DIAGNOSIS — I10 Essential (primary) hypertension: Secondary | ICD-10-CM

## 2019-06-20 ENCOUNTER — Other Ambulatory Visit: Payer: Self-pay

## 2019-06-20 ENCOUNTER — Ambulatory Visit (INDEPENDENT_AMBULATORY_CARE_PROVIDER_SITE_OTHER): Payer: Medicare Other | Admitting: Family Medicine

## 2019-06-20 ENCOUNTER — Encounter: Payer: Self-pay | Admitting: Family Medicine

## 2019-06-20 VITALS — BP 138/86 | HR 55 | Temp 97.1°F | Ht 60.0 in | Wt 161.6 lb

## 2019-06-20 DIAGNOSIS — Z13220 Encounter for screening for lipoid disorders: Secondary | ICD-10-CM | POA: Diagnosis not present

## 2019-06-20 DIAGNOSIS — I10 Essential (primary) hypertension: Secondary | ICD-10-CM

## 2019-06-20 LAB — LIPID PANEL
Cholesterol: 225 mg/dL — ABNORMAL HIGH (ref <200)
HDL: 61 mg/dL (ref >=50)
LDL Cholesterol (Calc): 131 mg/dL (calc) — ABNORMAL HIGH
Non-HDL Cholesterol (Calc): 164 mg/dL (calc) — ABNORMAL HIGH (ref <130)
Total CHOL/HDL Ratio: 3.7 (calc) (ref <5.0)
Triglycerides: 194 mg/dL — ABNORMAL HIGH (ref <150)

## 2019-06-20 LAB — COLOGUARD
COLOGUARD: NEGATIVE
Cologuard: NEGATIVE

## 2019-06-20 MED ORDER — LOSARTAN POTASSIUM-HCTZ 100-12.5 MG PO TABS: 1.0000 | ORAL_TABLET | Freq: Every day | ORAL | 1 refills | Status: DC

## 2019-06-20 MED ORDER — LISINOPRIL-HYDROCHLOROTHIAZIDE 20-25 MG PO TABS: 1.0000 | ORAL_TABLET | Freq: Every day | ORAL | 1 refills | Status: DC

## 2019-06-20 NOTE — Progress Notes (Signed)
Subjective:    Patient ID: Darlene Mcdaniel, female    DOB: 12-Nov-1950, 69 y.o.   MRN: [id number]  Darlene Mcdaniel is a 68 y.o. female presenting on 06/20/2019 for Hypertension   HPI  Hypertension - She is not checking BP at home or outside of clinic.    - Current medications: losartan/hydrochlorothiazide 50/12.5mg  , tolerating well without side effects - She is not currently symptomatic. - Pt denies headache, lightheadedness, dizziness, changes in vision, chest tightness/pressure, palpitations, leg swelling, sudden loss of speech or loss of consciousness. - She  reports no regular exercise routine. - Her diet is high in salt, high in fat, and high in carbohydrates.   Depression screen PHQ 2/9 05/23/2019  Decreased Interest 0  Down, Depressed, Hopeless 0  PHQ - 2 Score 0    Social History   Tobacco Use  . Smoking status: Current Every Day Smoker    Packs/day: 0.75    Years: 35.00    Pack years: 26.25    Types: Cigarettes  . Smokeless tobacco: Never Used  Substance Use Topics  . Alcohol use: Yes    Alcohol/week: 3.0 standard drinks    Types: 3 Cans of beer per week    Comment: 3-5  - only on the weekend  . Drug use: Never    Review of Systems  Constitutional: Negative.   HENT: Negative.   Eyes: Negative.   Respiratory: Negative.   Cardiovascular: Negative.   Gastrointestinal: Negative.   Endocrine: Negative.   Genitourinary: Negative.   Musculoskeletal: Negative.   Skin: Negative.   Allergic/Immunologic: Negative.   Neurological: Negative.   Hematological: Negative.   Psychiatric/Behavioral: Negative.    Per HPI unless specifically indicated above     Objective:    BP 138/86 (BP Location: Right Arm, Patient Position: Sitting, Cuff Size: Large)   Pulse (!) 55   Temp (!) 97.1 F (36.2 C) (Temporal)   Ht 5' (1.524 m)   Wt 161 lb 9.6 oz (73.3 kg)   SpO2 97%   BMI 31.56 kg/m   Wt Readings from Last 3 Encounters:  06/20/19 161 lb 9.6 oz (73.3 kg)    06/07/19 162 lb 3.2 oz (73.6 kg)  05/23/19 160 lb 9.6 oz (72.8 kg)    Physical Exam Vitals reviewed.  Constitutional:      General: She is not in acute distress.    Appearance: Normal appearance. She is obese. She is not ill-appearing or toxic-appearing.  HENT:     Head: Normocephalic.  Eyes:     General: Lids are normal. Vision grossly intact.        Right eye: No discharge.        Left eye: No discharge.     Extraocular Movements: Extraocular movements intact.     Conjunctiva/sclera: Conjunctivae normal.     Pupils: Pupils are equal, round, and reactive to light.  Cardiovascular:     Rate and Rhythm: Normal rate and regular rhythm.     Pulses: Normal pulses.     Heart sounds: Normal heart sounds. No murmur. No friction rub. No gallop.   Pulmonary:     Effort: Pulmonary effort is normal. No respiratory distress.     Breath sounds: Normal breath sounds.  Musculoskeletal:     Right lower leg: No edema.     Left lower leg: No edema.  Skin:    General: Skin is warm and dry.     Capillary Refill: Capillary refill takes less than 2 seconds.  Neurological:  General: No focal deficit present.     Mental Status: She is alert and oriented to person, place, and time.     Cranial Nerves: No cranial nerve deficit.     Sensory: No sensory deficit.     Motor: No weakness.     Coordination: Coordination normal.     Gait: Gait normal.  Psychiatric:        Attention and Perception: Attention and perception normal.        Mood and Affect: Mood and affect normal.        Speech: Speech normal.        Behavior: Behavior normal. Behavior is cooperative.        Thought Content: Thought content normal.        Cognition and Memory: Cognition and memory normal.        Judgment: Judgment normal.    Results for orders placed or performed in visit on 06/07/19  Cytology - PAP  Result Value Ref Range   Adequacy      Satisfactory for evaluation; transformation zone component PRESENT.    Diagnosis      - Negative for intraepithelial lesion or malignancy (NILM)   Microorganisms Shift in flora suggestive of bacterial vaginosis       Assessment & Plan:   Problem List Items Addressed This Visit      Cardiovascular and Mediastinum   Hypertension    Uncontrolled hypertension.  BP is not at goal < 130/80.  Pt is working on lifestyle modifications.  Taking medications tolerating well without side effects. No known complications.  Plan: 1. INCREASE Losartan/HCTZ from 50/12.5 to Losartan/HCTZ 100/12.5 2. Obtain labs for lipid levels today  3. Encouraged heart healthy diet and increasing exercise to 30 minutes most days of the week, going no more than 2 days in a row without exercise. 4. Check BP 1-2 x per week at home, keep log, and bring to clinic at next appointment. 5. Follow up 2 weeks.         Relevant Medications   losartan-hydrochlorothiazide (HYZAAR) 100-12.5 MG tablet    Other Visit Diagnoses    Screening for lipid disorders    -  Primary   Relevant Orders   Lipid Profile      Meds ordered this encounter  Medications  . DISCONTD: lisinopril-hydrochlorothiazide (ZESTORETIC) 20-25 MG tablet    Sig: Take 1 tablet by mouth daily.    Dispense:  30 tablet    Refill:  1  . losartan-hydrochlorothiazide (HYZAAR) 100-12.5 MG tablet    Sig: Take 1 tablet by mouth daily.    Dispense:  30 tablet    Refill:  1      Follow up plan: Return in about 2 weeks (around 07/04/2019) for Hypertension Follow Up.   Harlin Rain, [place] [facility] Nurse Practitioner [facility] 06/20/2019, 10:03 AM

## 2019-06-20 NOTE — Patient Instructions (Addendum)
As we discussed, I have increased your Losartan/HCTZ from 50/12.5 to Losartan/HCTZ 100/12.5.  The new prescription was sent to your pharmacy on file.  We will plan to see you back in 2 weeks to see if this lowers your blood pressure to the range of less than 130/80.  Please take your blood pressure 1-2x per week until we see you and bring to clinic with you.  Have your cholesterol labs drawn today  We will contact you once we receive your cologuard results.  Keep your appointment for your sleep study on 06/29/2019.  Schedule your Mammogram and Bone Density.  We will plan to see you back in 2 weeks for hypertension follow up  You will receive a survey after today's visit either digitally by e-mail or paper by USPS mail. Your experiences and feedback matter to [place].  Please respond so we know how we are doing as we provide care for you.  Call us with any questions/concerns/needs.  It is my goal to be available to you for your health concerns.  Thanks for choosing me to be a partner in your healthcare needs!  Charlaine Dalton, FNP-C Family Nurse Practitioner [facility] Phone: [phone]

## 2019-06-20 NOTE — Assessment & Plan Note (Signed)
Uncontrolled hypertension.  BP is not at goal < 130/80.  Pt is working on lifestyle modifications.  Taking medications tolerating well without side effects. No known complications.  Plan: 1. INCREASE Losartan/HCTZ from 50/12.5 to Losartan/HCTZ 100/12.5 2. Obtain labs for lipid levels today  3. Encouraged heart healthy diet and increasing exercise to 30 minutes most days of the week, going no more than 2 days in a row without exercise. 4. Check BP 1-2 x per week at home, keep log, and bring to clinic at next appointment. 5. Follow up 2 weeks.

## 2019-06-21 ENCOUNTER — Other Ambulatory Visit: Payer: Self-pay | Admitting: Family Medicine

## 2019-06-21 DIAGNOSIS — E785 Hyperlipidemia, unspecified: Secondary | ICD-10-CM | POA: Insufficient documentation

## 2019-06-21 DIAGNOSIS — E782 Mixed hyperlipidemia: Secondary | ICD-10-CM

## 2019-06-21 MED ORDER — ATORVASTATIN CALCIUM 40 MG PO TABS: 40.0000 mg | ORAL_TABLET | Freq: Every day | ORAL | 3 refills | Status: DC

## 2019-06-29 ENCOUNTER — Ambulatory Visit
Admission: RE | Admit: 2019-06-29 | Discharge: 2019-06-29 | Disposition: A | Discharge status: Home / Self Care | Payer: Medicare Other | Source: Ambulatory Visit | Attending: Oncology | Admitting: Oncology

## 2019-06-29 ENCOUNTER — Other Ambulatory Visit: Payer: Self-pay

## 2019-06-29 DIAGNOSIS — Z87891 Personal history of nicotine dependence: Secondary | ICD-10-CM | POA: Insufficient documentation

## 2019-06-29 DIAGNOSIS — F17210 Nicotine dependence, cigarettes, uncomplicated: Secondary | ICD-10-CM | POA: Diagnosis not present

## 2019-06-30 ENCOUNTER — Encounter: Payer: Self-pay | Admitting: *Deleted

## 2019-07-04 ENCOUNTER — Other Ambulatory Visit: Payer: Self-pay | Admitting: Family Medicine

## 2019-07-04 DIAGNOSIS — I10 Essential (primary) hypertension: Secondary | ICD-10-CM

## 2019-07-05 ENCOUNTER — Other Ambulatory Visit: Payer: Self-pay

## 2019-07-05 ENCOUNTER — Ambulatory Visit (INDEPENDENT_AMBULATORY_CARE_PROVIDER_SITE_OTHER): Payer: Medicare Other | Admitting: Family Medicine

## 2019-07-05 ENCOUNTER — Encounter: Payer: Self-pay | Admitting: Family Medicine

## 2019-07-05 VITALS — BP 147/77 | HR 65 | Temp 97.1°F | Ht 60.0 in | Wt 157.6 lb

## 2019-07-05 DIAGNOSIS — I10 Essential (primary) hypertension: Secondary | ICD-10-CM | POA: Diagnosis not present

## 2019-07-05 DIAGNOSIS — R911 Solitary pulmonary nodule: Secondary | ICD-10-CM | POA: Diagnosis not present

## 2019-07-05 MED ORDER — MELOXICAM 15 MG PO TABS: 15.0000 mg | ORAL_TABLET | Freq: Every day | ORAL | 1 refills | Status: DC

## 2019-07-05 MED ORDER — AMLODIPINE BESYLATE 10 MG PO TABS: 10.0000 mg | ORAL_TABLET | Freq: Every day | ORAL | 3 refills | Status: DC

## 2019-07-05 NOTE — Patient Instructions (Signed)
Continue your losartan-hydrocholorthiazide 100-25mg  daily and BEGIN Amlodipine 10mg  daily in addition.  Continue to take your blood pressure readings twice per day.  We will plan to see you back in clinic in 2 weeks for re-evaluation on your blood pressure.  If we are unable to get your blood pressure under 130/80, will discuss referral to cardiology for additional evaluation.  Your medication refills have been sent to your pharmacy on file.  Try to get exercise a minimum of 30 minutes per day at least 5 days per week as well as  adequate water intake all while measuring blood pressure a few times per week.  Keep a blood pressure log and bring back to clinic at your next visit.  If your readings are consistently over 140/90 to contact our office/send me a MyChart message and we will see you sooner.  Can try DASH and Mediterranean diet options, avoiding processed foods, lowering sodium intake, avoiding pork products, and eating a plant based diet for optimal health.  Education and discussion with patient regarding hypertension as well as the effects on the organs and body.  Specifically, we spoke about kidney disease, kidney failure, heart attack, stroke and up to and including death, as likely outcomes if non-compliant with blood pressure regulation.  Discussed how all of these habits are attached to each other and each has the effect on each other.      Mediterranean Diet  Why follow it? Research shows. . Those who follow the Mediterranean diet have a reduced risk of heart disease  . The diet is associated with a reduced incidence of Parkinson's and Alzheimer's diseases . People following the diet may have longer life expectancies and lower rates of chronic diseases  . The Dietary Guidelines for Americans recommends the Mediterranean diet as an eating plan to promote health and prevent disease  What Is the Mediterranean Diet?  . Healthy eating plan based on typical foods and recipes of  Mediterranean-style cooking . The diet is primarily a plant based diet; these foods should make up a majority of meals   Starches - Plant based foods should make up a majority of meals - They are an important sources of vitamins, minerals, energy, antioxidants, and fiber - Choose whole grains, foods high in fiber and minimally processed items  - Typical grain sources include wheat, oats, barley, corn, brown rice, bulgar, farro, millet, polenta, couscous  - Various types of beans include chickpeas, lentils, fava beans, black beans, white beans   Fruits  Veggies - Large quantities of antioxidant rich fruits & veggies; 6 or more servings  - Vegetables can be eaten raw or lightly drizzled with oil and cooked  - Vegetables common to the traditional Mediterranean Diet include: artichokes, arugula, beets, broccoli, brussel sprouts, cabbage, carrots, celery, collard greens, cucumbers, eggplant, kale, leeks, lemons, lettuce, mushrooms, okra, onions, peas, peppers, potatoes, pumpkin, radishes, rutabaga, shallots, spinach, sweet potatoes, turnips, zucchini - Fruits common to the Mediterranean Diet include: apples, apricots, avocados, cherries, clementines, dates, figs, grapefruits, grapes, melons, nectarines, oranges, peaches, pears, pomegranates, strawberries, tangerines  Fats - Replace butter and margarine with healthy oils, such as olive oil, canola oil, and tahini  - Limit nuts to no more than a handful a day  - Nuts include walnuts, almonds, pecans, pistachios, pine nuts  - Limit or avoid candied, honey roasted or heavily salted nuts - Olives are central to the Mediterranean diet - can be eaten whole or used in a variety of dishes   Meats  Protein - Limiting red meat: no more than a few times a month - When eating red meat: choose lean cuts and keep the portion to the size of deck of cards - Eggs: approx. 0 to 4 times a week  - Fish and lean poultry: at least 2 a week  - Healthy protein sources  include, chicken, [place], lean beef, lamb - Increase intake of seafood such as tuna, salmon, trout, mackerel, shrimp, scallops - Avoid or limit high fat processed meats such as sausage and bacon  Dairy - Include moderate amounts of low fat dairy products  - Focus on healthy dairy such as fat free yogurt, skim milk, low or reduced fat cheese - Limit dairy products higher in fat such as whole or 2% milk, cheese, ice cream  Alcohol - Moderate amounts of red wine is ok  - No more than 5 oz daily for women (all ages) and men older than age 69  - No more than 10 oz of wine daily for men younger than 1  Other - Limit sweets and other desserts  - Use herbs and spices instead of salt to flavor foods  - Herbs and spices common to the traditional Mediterranean Diet include: basil, bay leaves, chives, cloves, cumin, fennel, garlic, lavender, marjoram, mint, oregano, parsley, pepper, rosemary, sage, savory, sumac, tarragon, thyme   It's not just a diet, it's a lifestyle:  . The Mediterranean diet includes lifestyle factors typical of those in the region  . Foods, drinks and meals are best eaten with others and savored . Daily physical activity is important for overall good health . This could be strenuous exercise like running and aerobics . This could also be more leisurely activities such as walking, housework, yard-work, or taking the stairs . Moderation is the key; a balanced and healthy diet accommodates most foods and drinks . Consider portion sizes and frequency of consumption of certain foods   Meal Ideas & Options:  . Breakfast:  o Whole wheat toast or whole wheat English muffins with peanut butter & hard boiled egg o Steel cut oats topped with apples & cinnamon and skim milk  o Fresh fruit: banana, strawberries, melon, berries, peaches  o Smoothies: strawberries, bananas, greek yogurt, peanut butter o Low fat greek yogurt with blueberries and granola  o Egg white omelet with spinach and  mushrooms o Breakfast couscous: whole wheat couscous, apricots, skim milk, cranberries  . Sandwiches:  o Hummus and grilled vegetables (peppers, zucchini, squash) on whole wheat bread   o Grilled chicken on whole wheat pita with lettuce, tomatoes, cucumbers or tzatziki  o Tuna salad on whole wheat bread: tuna salad made with greek yogurt, olives, red peppers, capers, green onions o Garlic rosemary lamb pita: lamb sauted with garlic, rosemary, salt & pepper; add lettuce, cucumber, greek yogurt to pita - flavor with lemon juice and black pepper  . Seafood:  o Mediterranean grilled salmon, seasoned with garlic, basil, parsley, lemon juice and black pepper o Shrimp, lemon, and spinach whole-grain pasta salad made with low fat greek yogurt  o Seared scallops with lemon orzo  o Seared tuna steaks seasoned salt, pepper, coriander topped with tomato mixture of olives, tomatoes, olive oil, minced garlic, parsley, green onions and cappers  . Meats:  o Herbed greek chicken salad with kalamata olives, cucumber, feta  o Red bell peppers stuffed with spinach, bulgur, lean ground beef (or lentils) & topped with feta   o Kebabs: skewers of chicken, tomatoes, onions, zucchini,  squash  o [place] burgers: made with red onions, mint, dill, lemon juice, feta cheese topped with roasted red peppers . Vegetarian o Cucumber salad: cucumbers, artichoke hearts, celery, red onion, feta cheese, tossed in olive oil & lemon juice  o Hummus and whole grain pita points with a greek salad (lettuce, tomato, feta, olives, cucumbers, red onion) o Lentil soup with celery, carrots made with vegetable broth, garlic, salt and pepper  o Tabouli salad: parsley, bulgur, mint, scallions, cucumbers, tomato, radishes, lemon juice, olive oil, salt and pepper.  We will plan to see you back in 2 weeks for hypertension follow up  You will receive a survey after today's visit either digitally by e-mail or paper by USPS mail. Your experiences  and feedback matter to [place].  Please respond so we know how we are doing as we provide care for you.  Call us with any questions/concerns/needs.  It is my goal to be available to you for your health concerns.  Thanks for choosing me to be a partner in your healthcare needs!  Charlaine Dalton, FNP-C Family Nurse Practitioner [facility] Phone: [phone]

## 2019-07-05 NOTE — Progress Notes (Signed)
Subjective:    Patient ID: Darlene Mcdaniel, female    DOB: 1950-11-05, 69 y.o.   MRN: [id number]  Darlene Mcdaniel is a 69 y.o. female presenting on 07/05/2019 for Hypertension   HPI  Hypertension - She is checking BP at home or outside of clinic.  Readings 140's/80's - Current medications: losartan-hydrochlorothiazide 100-12.5mg  daily, tolerating well without side effects - She is not currently symptomatic. - Pt denies headache, lightheadedness, dizziness, changes in vision, chest tightness/pressure, palpitations, leg swelling, sudden loss of speech or loss of consciousness. - She  reports no regular exercise routine. - Her diet is high in salt, high in fat, and high in carbohydrates.  Depression screen PHQ 2/9 05/23/2019  Decreased Interest 0  Down, Depressed, Hopeless 0  PHQ - 2 Score 0    Social History   Tobacco Use  . Smoking status: Current Every Day Smoker    Packs/day: 0.75    Years: 35.00    Pack years: 26.25    Types: Cigarettes  . Smokeless tobacco: Never Used  Substance Use Topics  . Alcohol use: Yes    Alcohol/week: 3.0 standard drinks    Types: 3 Cans of beer per week    Comment: 3-5  - only on the weekend  . Drug use: Never    Review of Systems  Constitutional: Negative.   HENT: Negative.   Eyes: Negative.   Respiratory: Negative.   Cardiovascular: Negative.   Gastrointestinal: Negative.   Endocrine: Negative.   Genitourinary: Negative.   Musculoskeletal: Negative.   Skin: Negative.   Allergic/Immunologic: Negative.   Neurological: Negative.   Hematological: Negative.   Psychiatric/Behavioral: Negative.    Per HPI unless specifically indicated above     Objective:    BP (!) 147/77 (BP Location: Left Arm, Patient Position: Sitting, Cuff Size: Normal)   Pulse 65   Temp (!) 97.1 F (36.2 C) (Temporal)   Ht 5' (1.524 m)   Wt 157 lb 9.6 oz (71.5 kg)   BMI 30.78 kg/m   Wt Readings from Last 3 Encounters:  07/05/19 157 lb 9.6 oz (71.5 kg)    06/29/19 161 lb (73 kg)  06/20/19 161 lb 9.6 oz (73.3 kg)    Physical Exam Vitals reviewed.  Constitutional:      General: She is not in acute distress.    Appearance: Normal appearance. She is well-developed and well-groomed. She is obese. She is not ill-appearing or toxic-appearing.  HENT:     Head: Normocephalic.     Nose:     Comments: Lesia Sago is in place, covering mouth and nose  Eyes:     General: Lids are normal. Vision grossly intact.        Right eye: No discharge.        Left eye: No discharge.     Extraocular Movements: Extraocular movements intact.     Conjunctiva/sclera: Conjunctivae normal.     Pupils: Pupils are equal, round, and reactive to light.  Cardiovascular:     Rate and Rhythm: Normal rate and regular rhythm.     Pulses: Normal pulses.     Heart sounds: Normal heart sounds. No murmur. No friction rub. No gallop.   Pulmonary:     Effort: Pulmonary effort is normal. No respiratory distress.     Breath sounds: Normal breath sounds.  Musculoskeletal:     Right lower leg: No edema.     Left lower leg: No edema.  Skin:    General: Skin is warm and dry.  Capillary Refill: Capillary refill takes less than 2 seconds.  Neurological:     General: No focal deficit present.     Mental Status: She is alert and oriented to person, place, and time.     Cranial Nerves: No cranial nerve deficit.     Sensory: No sensory deficit.     Motor: No weakness.     Coordination: Coordination normal.     Gait: Gait normal.  Psychiatric:        Attention and Perception: Attention and perception normal.        Mood and Affect: Mood and affect normal.        Speech: Speech normal.        Behavior: Behavior normal. Behavior is cooperative.        Thought Content: Thought content normal.        Cognition and Memory: Cognition and memory normal.        Judgment: Judgment normal.    Results for orders placed or performed in visit on 06/22/19  Cologuard  Result Value Ref  Range   Cologuard Negative Negative      Assessment & Plan:   Problem List Items Addressed This Visit      Cardiovascular and Mediastinum   Hypertension - Primary    Uncontrolled hypertension.  BP is not at goal < 130/80.  Pt is working on lifestyle modifications.  Taking medications tolerating well without side effects. Complications: hyperlipidemia, cigarette smoker  Plan: 1. Continue taking losartan-hydrochlorothiazide 100-12.5mg  daily and BEGIN amlodipine 10mg  daily 2. Obtain labs before next visit  3. Encouraged heart healthy diet and increasing exercise to 30 minutes most days of the week, going no more than 2 days in a row without exercise. 4. Check BP 1-2 x per day at home, keep log, and bring to clinic at next appointment. 5. Follow up 2 weeks and if continued elevated BP will refer to cardiology for assistance with BP control        Relevant Medications   amLODipine (NORVASC) 10 MG tablet   meloxicam (MOBIC) 15 MG tablet     Other   Lung nodule    Reviewed LDCT results with patient, verbalized understanding and will repeat in 1 year.         Meds ordered this encounter  Medications  . amLODipine (NORVASC) 10 MG tablet    Sig: Take 1 tablet (10 mg total) by mouth daily.    Dispense:  90 tablet    Refill:  3  . meloxicam (MOBIC) 15 MG tablet    Sig: Take 1 tablet (15 mg total) by mouth daily.    Dispense:  90 tablet    Refill:  1      Follow up plan: Return in about 2 weeks (around 07/19/2019) for HTN re-evaluation.   Harlin Rain, [facility] Nurse Practitioner [facility] 07/05/2019, 9:51 AM

## 2019-07-05 NOTE — Assessment & Plan Note (Signed)
Uncontrolled hypertension.  BP is not at goal < 130/80.  Pt is working on lifestyle modifications.  Taking medications tolerating well without side effects. Complications: hyperlipidemia, cigarette smoker  Plan: 1. Continue taking losartan-hydrochlorothiazide 100-12.5mg  daily and BEGIN amlodipine 10mg  daily 2. Obtain labs before next visit  3. Encouraged heart healthy diet and increasing exercise to 30 minutes most days of the week, going no more than 2 days in a row without exercise. 4. Check BP 1-2 x per day at home, keep log, and bring to clinic at next appointment. 5. Follow up 2 weeks and if continued elevated BP will refer to cardiology for assistance with BP control

## 2019-07-05 NOTE — Assessment & Plan Note (Signed)
Reviewed LDCT results with patient, verbalized understanding and will repeat in 1 year.

## 2019-07-11 ENCOUNTER — Ambulatory Visit: Payer: Medicare Other | Attending: Neurology

## 2019-07-11 DIAGNOSIS — G4733 Obstructive sleep apnea (adult) (pediatric): Secondary | ICD-10-CM | POA: Insufficient documentation

## 2019-07-12 ENCOUNTER — Other Ambulatory Visit: Payer: Self-pay

## 2019-07-18 ENCOUNTER — Encounter: Payer: Self-pay | Admitting: Family Medicine

## 2019-07-18 DIAGNOSIS — G4733 Obstructive sleep apnea (adult) (pediatric): Secondary | ICD-10-CM | POA: Insufficient documentation

## 2019-07-19 ENCOUNTER — Ambulatory Visit (INDEPENDENT_AMBULATORY_CARE_PROVIDER_SITE_OTHER): Payer: Medicare Other | Admitting: Family Medicine

## 2019-07-19 ENCOUNTER — Encounter: Payer: Self-pay | Admitting: Family Medicine

## 2019-07-19 ENCOUNTER — Other Ambulatory Visit: Payer: Self-pay

## 2019-07-19 VITALS — BP 120/70 | HR 61 | Temp 97.1°F | Ht 60.0 in | Wt 159.2 lb

## 2019-07-19 DIAGNOSIS — G4733 Obstructive sleep apnea (adult) (pediatric): Secondary | ICD-10-CM

## 2019-07-19 DIAGNOSIS — E782 Mixed hyperlipidemia: Secondary | ICD-10-CM | POA: Diagnosis not present

## 2019-07-19 DIAGNOSIS — I10 Essential (primary) hypertension: Secondary | ICD-10-CM | POA: Diagnosis not present

## 2019-07-19 MED ORDER — AMLODIPINE BESYLATE 10 MG PO TABS: 10.0000 mg | ORAL_TABLET | Freq: Every day | ORAL | 3 refills | Status: DC

## 2019-07-19 MED ORDER — LOSARTAN POTASSIUM-HCTZ 100-12.5 MG PO TABS: 1.0000 | ORAL_TABLET | Freq: Every day | ORAL | 3 refills | Status: DC

## 2019-07-19 NOTE — Assessment & Plan Note (Signed)
Reviewed sleep study results showing obstructive sleep apnea, that the order for repeat sleep study with CPAP titration was ordered and faxed back to the sleep study company last evening.  Patient educated will get a call to return for additional evaluation.  Plan: 1. Repeat sleep study with CPAP titration

## 2019-07-19 NOTE — Progress Notes (Signed)
Subjective:    Patient ID: Darlene Mcdaniel, female    DOB: October 06, 1950, 69 y.o.   MRN: [id number]  Darlene Mcdaniel is a 69 y.o. female presenting on 07/19/2019 for Hypertension   HPI  Hypertension - She is checking BP at home or outside of clinic.  Readings <120/70 - Current medications: amlodipine 10mg  daily and losartan-hydrochlorothiazide 100-12.5mg  daily, tolerating well without side effects - She is not currently symptomatic. - Pt denies headache, lightheadedness, dizziness, changes in vision, chest tightness/pressure, palpitations, leg swelling, sudden loss of speech or loss of consciousness. - She  reports no regular exercise routine. - Her diet is high in salt, high in fat, and high in carbohydrates.  Reports since adding the amlodipine to her hypertension treatment plan she is feeling much better.  No acute concerns today.  Depression screen PHQ 2/9 05/23/2019  Decreased Interest 0  Down, Depressed, Hopeless 0  PHQ - 2 Score 0    Social History   Tobacco Use  . Smoking status: Current Every Day Smoker    Packs/day: 0.75    Years: 35.00    Pack years: 26.25    Types: Cigarettes  . Smokeless tobacco: Never Used  Vaping Use  . Vaping Use: Never used  Substance Use Topics  . Alcohol use: Yes    Alcohol/week: 3.0 standard drinks    Types: 3 Cans of beer per week    Comment: 3-5  - only on the weekend  . Drug use: Never    Review of Systems  Constitutional: Negative.   HENT: Negative.   Eyes: Negative.   Respiratory: Negative.   Cardiovascular: Negative.   Gastrointestinal: Negative.   Endocrine: Negative.   Genitourinary: Negative.   Musculoskeletal: Negative.   Skin: Negative.   Allergic/Immunologic: Negative.   Neurological: Negative.   Hematological: Negative.   Psychiatric/Behavioral: Negative.    Per HPI unless specifically indicated above     Objective:    BP 120/70 (BP Location: Left Arm, Patient Position: Sitting, Cuff Size: Normal)   Pulse  61   Temp (!) 97.1 F (36.2 C) (Temporal)   Ht 5' (1.524 m)   Wt 159 lb 3.2 oz (72.2 kg)   BMI 31.09 kg/m   Wt Readings from Last 3 Encounters:  07/19/19 159 lb 3.2 oz (72.2 kg)  07/05/19 157 lb 9.6 oz (71.5 kg)  06/29/19 161 lb (73 kg)    Physical Exam Vitals reviewed.  Constitutional:      General: She is not in acute distress.    Appearance: Normal appearance. She is well-developed and well-groomed. She is obese. She is not ill-appearing or toxic-appearing.  HENT:     Head: Normocephalic.     Nose:     Comments: 07/01/19 is in place, covering mouth and nose  Eyes:     General: Lids are normal. Vision grossly intact.        Right eye: No discharge.        Left eye: No discharge.     Extraocular Movements: Extraocular movements intact.     Conjunctiva/sclera: Conjunctivae normal.     Pupils: Pupils are equal, round, and reactive to light.  Cardiovascular:     Rate and Rhythm: Normal rate and regular rhythm.     Pulses: Normal pulses.          Dorsalis pedis pulses are 2+ on the right side and 2+ on the left side.     Heart sounds: Normal heart sounds. No murmur heard.  No friction  rub. No gallop.   Pulmonary:     Effort: Pulmonary effort is normal. No respiratory distress.     Breath sounds: Normal breath sounds.  Musculoskeletal:     Right lower leg: No edema.     Left lower leg: No edema.  Feet:     Right foot:     Skin integrity: Skin integrity normal.     Left foot:     Skin integrity: Skin integrity normal.  Skin:    General: Skin is warm and dry.     Capillary Refill: Capillary refill takes less than 2 seconds.  Neurological:     General: No focal deficit present.     Mental Status: She is alert and oriented to person, place, and time.     Cranial Nerves: No cranial nerve deficit.     Sensory: No sensory deficit.     Motor: No weakness.     Coordination: Coordination normal.     Gait: Gait normal.  Psychiatric:        Attention and Perception:  Attention and perception normal.        Mood and Affect: Mood and affect normal.        Speech: Speech normal.        Behavior: Behavior normal. Behavior is cooperative.        Thought Content: Thought content normal.        Cognition and Memory: Cognition and memory normal.        Judgment: Judgment normal.    Results for orders placed or performed in visit on 06/22/19  Cologuard  Result Value Ref Range   Cologuard Negative Negative      Assessment & Plan:   Problem List Items Addressed This Visit      Cardiovascular and Mediastinum   Hypertension    Controlled hypertension.  BP is at goal < 130/80.  Pt has been working on lifestyle modifications.  Taking medications tolerating well without side effects.  Complications: hyperlipidemia, obesity, cigarette smoker  Plan: 1. Continue taking amlodipine 10mg  daily and losartan-hydrochlorothiazide 100-12.5mg  daily 2. Obtain labs before next office visit  3. Encouraged heart healthy diet and increasing exercise to 30 minutes most days of the week, going no more than 2 days in a row without exercise. 4. Check BP 1-2 x per week at home, keep log, and bring to clinic at next appointment. 5. Follow up 3 months.         Relevant Medications   amLODipine (NORVASC) 10 MG tablet   losartan-hydrochlorothiazide (HYZAAR) 100-12.5 MG tablet     Respiratory   Obstructive sleep apnea - Primary    Reviewed sleep study results showing obstructive sleep apnea, that the order for repeat sleep study with CPAP titration was ordered and faxed back to the sleep study company last evening.  Patient educated will get a call to return for additional evaluation.  Plan: 1. Repeat sleep study with CPAP titration        Other   Hyperlipidemia   Relevant Medications   amLODipine (NORVASC) 10 MG tablet   losartan-hydrochlorothiazide (HYZAAR) 100-12.5 MG tablet      Meds ordered this encounter  Medications  . amLODipine (NORVASC) 10 MG tablet     Sig: Take 1 tablet (10 mg total) by mouth daily.    Dispense:  90 tablet    Refill:  3  . losartan-hydrochlorothiazide (HYZAAR) 100-12.5 MG tablet    Sig: Take 1 tablet by mouth daily.    Dispense:  90 tablet    Refill:  3      Follow up plan: Return in about 3 months (around 10/19/2019) for Hypertension follow up.   Harlin Rain, [place] Family Nurse Practitioner [facility] 07/19/2019, 8:14 AM

## 2019-07-19 NOTE — Assessment & Plan Note (Signed)
Controlled hypertension.  BP is at goal < 130/80.  Pt has been working on lifestyle modifications.  Taking medications tolerating well without side effects.  Complications: hyperlipidemia, obesity, cigarette smoker  Plan: 1. Continue taking amlodipine 10mg  daily and losartan-hydrochlorothiazide 100-12.5mg  daily 2. Obtain labs before next office visit  3. Encouraged heart healthy diet and increasing exercise to 30 minutes most days of the week, going no more than 2 days in a row without exercise. 4. Check BP 1-2 x per week at home, keep log, and bring to clinic at next appointment. 5. Follow up 3 months.

## 2019-07-19 NOTE — Patient Instructions (Signed)
Your medication refills have been sent to your pharmacy on file.  For Mammogram screening for breast cancer DEXA Scan (Bone mineral density) screening for osteoporosis  Call the Imaging Center below anytime to schedule your own appointment now that order has been placed.  [facility] [address] [place], [place] [address] Phone: [phone]  [facility] [address] [place], [place] [address] Phone: [phone]  Try to get exercise a minimum of 30 minutes per day at least 5 days per week as well as  adequate water intake all while measuring blood pressure a few times per week.  Keep a blood pressure log and bring back to clinic at your next visit.  If your readings are consistently over 130/80 to contact our office/send me a MyChart message and we will see you sooner.  Can try DASH and Mediterranean diet options, avoiding processed foods, lowering sodium intake, avoiding pork products, and eating a plant based diet for optimal health.  We will plan to see you back in 3 months for hypertension follow up  You will receive a survey after today's visit either digitally by e-mail or paper by USPS mail. Your experiences and feedback matter to [place].  Please respond so we know how we are doing as we provide care for you.  Call us with any questions/concerns/needs.  It is my goal to be available to you for your health concerns.  Thanks for choosing me to be a partner in your healthcare needs!  Charlaine Dalton, FNP-C Family Nurse Practitioner [facility] Phone: [phone]

## 2019-08-01 ENCOUNTER — Ambulatory Visit (INDEPENDENT_AMBULATORY_CARE_PROVIDER_SITE_OTHER): Payer: Medicare Other

## 2019-08-01 VITALS — Ht 60.0 in | Wt 160.0 lb

## 2019-08-01 DIAGNOSIS — Z0000 Encounter for general adult medical examination without abnormal findings: Secondary | ICD-10-CM | POA: Diagnosis not present

## 2019-08-01 NOTE — Patient Instructions (Signed)
Darlene Mcdaniel , Thank you for taking time to come for your Medicare Wellness Visit. I appreciate your ongoing commitment to your health goals. Please review the following plan we discussed and let me know if I can assist you in the future.   Screening recommendations/referrals: Colonoscopy: cologuard completed 06/20/2019, due 06/20/2022 Mammogram: ordered previously Bone Density: ordered previously Recommended yearly ophthalmology/optometry visit for glaucoma screening and checkup Recommended yearly dental visit for hygiene and checkup  Vaccinations: Influenza vaccine: missed season Pneumococcal vaccine: 06/07/2019 Tdap vaccine: due Shingles vaccine: discussed   Covid-19:03/21/2019, 04/18/2019  Advanced directives: Advance directive discussed with you today. Even though you declined this today please call our office should you change your mind and we can give you the proper paperwork for you to fill out.   Conditions/risks identified: smoking  Next appointment: Follow up in one year for your annual wellness visit    Preventive Care 65 Years and Older, Female Preventive care refers to lifestyle choices and visits with your health care provider that can promote health and wellness. What does preventive care include?  A yearly physical exam. This is also called an annual well check.  Dental exams once or twice a year.  Routine eye exams. Ask your health care provider how often you should have your eyes checked.  Personal lifestyle choices, including:  Daily care of your teeth and gums.  Regular physical activity.  Eating a healthy diet.  Avoiding tobacco and drug use.  Limiting alcohol use.  Practicing safe sex.  Taking low-dose aspirin every day.  Taking vitamin and mineral supplements as recommended by your health care provider. What happens during an annual well check? The services and screenings done by your health care provider during your annual well check will depend  on your age, overall health, lifestyle risk factors, and family history of disease. Counseling  Your health care provider may ask you questions about your:  Alcohol use.  Tobacco use.  Drug use.  Emotional well-being.  Home and relationship well-being.  Sexual activity.  Eating habits.  History of falls.  Memory and ability to understand (cognition).  Work and work Astronomer.  Reproductive health. Screening  You may have the following tests or measurements:  Height, weight, and BMI.  Blood pressure.  Lipid and cholesterol levels. These may be checked every 5 years, or more frequently if you are over 62 years old.  Skin check.  Lung cancer screening. You may have this screening every year starting at age 53 if you have a 30-pack-year history of smoking and currently smoke or have quit within the past 15 years.  Fecal occult blood test (FOBT) of the stool. You may have this test every year starting at age 71.  Flexible sigmoidoscopy or colonoscopy. You may have a sigmoidoscopy every 5 years or a colonoscopy every 10 years starting at age 32.  Hepatitis C blood test.  Hepatitis B blood test.  Sexually transmitted disease (STD) testing.  Diabetes screening. This is done by checking your blood sugar (glucose) after you have not eaten for a while (fasting). You may have this done every 1-3 years.  Bone density scan. This is done to screen for osteoporosis. You may have this done starting at age 28.  Mammogram. This may be done every 1-2 years. Talk to your health care provider about how often you should have regular mammograms. Talk with your health care provider about your test results, treatment options, and if necessary, the need for more tests. Vaccines  Your health care provider may recommend certain vaccines, such as:  Influenza vaccine. This is recommended every year.  Tetanus, diphtheria, and acellular pertussis (Tdap, Td) vaccine. You may need a Td  booster every 10 years.  Zoster vaccine. You may need this after age 61.  Pneumococcal 13-valent conjugate (PCV13) vaccine. One dose is recommended after age 67.  Pneumococcal polysaccharide (PPSV23) vaccine. One dose is recommended after age 71. Talk to your health care provider about which screenings and vaccines you need and how often you need them. This information is not intended to replace advice given to you by your health care provider. Make sure you discuss any questions you have with your health care provider. Document Released: 02/15/2015 Document Revised: 10/09/2015 Document Reviewed: 11/20/2014 Elsevier Interactive Patient Education  2017 [place] Prevention in the Home Falls can cause injuries. They can happen to people of all ages. There are many things you can do to make your home safe and to help prevent falls. What can I do on the outside of my home?  Regularly fix the edges of walkways and driveways and fix any cracks.  Remove anything that might make you trip as you walk through a door, such as a raised step or threshold.  Trim any bushes or trees on the path to your home.  Use bright outdoor lighting.  Clear any walking paths of anything that might make someone trip, such as rocks or tools.  Regularly check to see if handrails are loose or broken. Make sure that both sides of any steps have handrails.  Any raised decks and porches should have guardrails on the edges.  Have any leaves, snow, or ice cleared regularly.  Use sand or salt on walking paths during winter.  Clean up any spills in your garage right away. This includes oil or grease spills. What can I do in the bathroom?  Use night lights.  Install grab bars by the toilet and in the tub and shower. Do not use towel bars as grab bars.  Use non-skid mats or decals in the tub or shower.  If you need to sit down in the shower, use a plastic, non-slip stool.  Keep the floor dry. Clean up  any water that spills on the floor as soon as it happens.  Remove soap buildup in the tub or shower regularly.  Attach bath mats securely with double-sided non-slip rug tape.  Do not have throw rugs and other things on the floor that can make you trip. What can I do in the bedroom?  Use night lights.  Make sure that you have a light by your bed that is easy to reach.  Do not use any sheets or blankets that are too big for your bed. They should not hang down onto the floor.  Have a firm chair that has side arms. You can use this for support while you get dressed.  Do not have throw rugs and other things on the floor that can make you trip. What can I do in the kitchen?  Clean up any spills right away.  Avoid walking on wet floors.  Keep items that you use a lot in easy-to-reach places.  If you need to reach something above you, use a strong step stool that has a grab bar.  Keep electrical cords out of the way.  Do not use floor polish or wax that makes floors slippery. If you must use wax, use non-skid floor wax.  Do  not have throw rugs and other things on the floor that can make you trip. What can I do with my stairs?  Do not leave any items on the stairs.  Make sure that there are handrails on both sides of the stairs and use them. Fix handrails that are broken or loose. Make sure that handrails are as long as the stairways.  Check any carpeting to make sure that it is firmly attached to the stairs. Fix any carpet that is loose or worn.  Avoid having throw rugs at the top or bottom of the stairs. If you do have throw rugs, attach them to the floor with carpet tape.  Make sure that you have a light switch at the top of the stairs and the bottom of the stairs. If you do not have them, ask someone to add them for you. What else can I do to help prevent falls?  Wear shoes that:  Do not have high heels.  Have rubber bottoms.  Are comfortable and fit you well.  Are  closed at the toe. Do not wear sandals.  If you use a stepladder:  Make sure that it is fully opened. Do not climb a closed stepladder.  Make sure that both sides of the stepladder are locked into place.  Ask someone to hold it for you, if possible.  Clearly mark and make sure that you can see:  Any grab bars or handrails.  First and last steps.  Where the edge of each step is.  Use tools that help you move around (mobility aids) if they are needed. These include:  Canes.  Walkers.  Scooters.  Crutches.  Turn on the lights when you go into a dark area. Replace any light bulbs as soon as they burn out.  Set up your furniture so you have a clear path. Avoid moving your furniture around.  If any of your floors are uneven, fix them.  If there are any pets around you, be aware of where they are.  Review your medicines with your doctor. Some medicines can make you feel dizzy. This can increase your chance of falling. Ask your doctor what other things that you can do to help prevent falls. This information is not intended to replace advice given to you by your health care provider. Make sure you discuss any questions you have with your health care provider. Document Released: 11/15/2008 Document Revised: 06/27/2015 Document Reviewed: 02/23/2014 Elsevier Interactive Patient Education  2017 [employer].

## 2019-08-01 NOTE — Progress Notes (Cosign Needed)
I connected with [name] today by telephone and verified that I am speaking with the correct person using two identifiers. Location patient: home Location provider: work Persons participating in the virtual visit: Veryl Abril, Elisha Ponder LPN.   I discussed the limitations, risks, security and privacy concerns of performing an evaluation and management service by telephone and the availability of in person appointments. I also discussed with the patient that there may be a patient responsible charge related to this service. The patient expressed understanding and verbally consented to this telephonic visit.    Interactive audio and video telecommunications were attempted between this provider and patient, however failed, due to patient having technical difficulties OR patient did not have access to video capability.  We continued and completed visit with audio only.     Vital signs may be patient reported or missing.  Subjective:   Darlene Mcdaniel is a 69 y.o. female who presents for Medicare Annual (Subsequent) preventive examination.  Review of Systems     Cardiac Risk Factors include: advanced age (>81men, >34 women);hypertension;sedentary lifestyle;smoking/ tobacco exposure     Objective:    Today's Vitals   08/01/19 0821  Weight: 160 lb (72.6 kg)  Height: 5' (1.524 m)   Body mass index is 31.25 kg/m.  Advanced Directives 08/01/2019  Does Patient Have a Medical Advance Directive? No    Current Medications (verified) Outpatient Encounter Medications as of 08/01/2019  Medication Sig  . amLODipine (NORVASC) 10 MG tablet Take 1 tablet (10 mg total) by mouth daily.  Marland Kitchen atorvastatin (LIPITOR) 40 MG tablet Take 1 tablet (40 mg total) by mouth daily.  . Blood Pressure Monitoring (BLOOD PRESSURE KIT) DEVI 1 Units by Does not apply route 2 (two) times daily.  . diclofenac Sodium (VOLTAREN) 1 % GEL Apply 2 g topically 4 (four) times daily.  Marland Kitchen losartan-hydrochlorothiazide  (HYZAAR) 100-12.5 MG tablet Take 1 tablet by mouth daily.  . meloxicam (MOBIC) 15 MG tablet Take 1 tablet (15 mg total) by mouth daily.   No facility-administered encounter medications on file as of 08/01/2019.    Allergies (verified) Patient has no known allergies.   History: History reviewed. No pertinent past medical history. History reviewed. No pertinent surgical history. Family History  Problem Relation Age of Onset  . Heart disease Mother   . Stroke Mother   . Diabetes Mother   . Cancer Mother   . Leukemia Mother   . Breast cancer Sister    Social History   Socioeconomic History  . Marital status: Single    Spouse name: Not on file  . Number of children: Not on file  . Years of education: Not on file  . Highest education level: Not on file  Occupational History  . Occupation: retired  Tobacco Use  . Smoking status: Current Every Day Smoker    Packs/day: 0.75    Years: 35.00    Pack years: 26.25    Types: Cigarettes  . Smokeless tobacco: Never Used  Vaping Use  . Vaping Use: Never used  Substance and Sexual Activity  . Alcohol use: Not Currently    Comment: occassionally  . Drug use: Never  . Sexual activity: Yes  Other Topics Concern  . Not on file  Social History Narrative  . Not on file   Social Determinants of Health   Financial Resource Strain: Low Risk   . Difficulty of Paying Living Expenses: Not hard at all  Food Insecurity: No Food Insecurity  . Worried About  Running Out of Food in the Last Year: Never true  . Ran Out of Food in the Last Year: Never true  Transportation Needs: No Transportation Needs  . Lack of Transportation (Medical): No  . Lack of Transportation (Non-Medical): No  Physical Activity: Inactive  . Days of Exercise per Week: 0 days  . Minutes of Exercise per Session: 0 min  Stress: No Stress Concern Present  . Feeling of Stress : Not at all  Social Connections:   . Frequency of Communication with Friends and Family:   .  Frequency of Social Gatherings with Friends and Family:   . Attends Religious Services:   . Active Member of Clubs or Organizations:   . Attends Archivist Meetings:   Marland Kitchen Marital Status:     Tobacco Counseling Ready to quit: Yes Counseling given: Yes   Clinical Intake:  Pre-visit preparation completed: Yes  Pain : No/denies pain     Nutritional Status: BMI > 30  Obese Nutritional Risks: None Diabetes: No  How often do you need to have someone help you when you read instructions, pamphlets, or other written materials from your doctor or pharmacy?: 1 - Never What is the last grade level you completed in school?: 12th grade  Diabetic? no  Interpreter Needed?: No  Information entered by :: NAllen LPN   Activities of Daily Living In your present state of health, do you have any difficulty performing the following activities: 08/01/2019 05/23/2019  Hearing? N N  Vision? N Y  Difficulty concentrating or making decisions? N N  Walking or climbing stairs? N N  Dressing or bathing? N N  Doing errands, shopping? N N  Preparing Food and eating ? N -  Using the Toilet? N -  In the past six months, have you accidently leaked urine? Y -  Comment wears a pad -  Do you have problems with loss of bowel control? N -  Managing your Medications? N -  Managing your Finances? N -  Housekeeping or managing your Housekeeping? N -  Some recent data might be hidden    Patient Care Team: Malfi, Lupita Raider, FNP as PCP - General (Family Medicine)  Indicate any recent Medical Services you may have received from other than [facility] providers in the past year (date may be approximate).     Assessment:   This is a routine wellness examination for Darlene Mcdaniel.  Hearing/Vision screen  Hearing Screening   '125Hz'$  '250Hz'$  '500Hz'$  '1000Hz'$  '2000Hz'$  '3000Hz'$  '4000Hz'$  '6000Hz'$  '8000Hz'$   Right ear:           Left ear:           Vision Screening Comments: Regular eye exams. Dr. Wyatt Portela  Dietary issues and  exercise activities discussed: Current Exercise Habits: The patient does not participate in regular exercise at present  Goals    . Patient Stated     08/01/2019, no goals      Depression Screen PHQ 2/9 Scores 08/01/2019 05/23/2019  PHQ - 2 Score 0 0    Fall Risk Fall Risk  08/01/2019 05/23/2019  Falls in the past year? 0 0  Number falls in past yr: - 0  Injury with Fall? - 0  Risk for fall due to : Medication side effect No Fall Risks  Follow up Falls evaluation completed;Education provided;Falls prevention discussed -    Any stairs in or around the home? No  If so, are there any without handrails? n/a Home free of loose throw rugs in  walkways, pet beds, electrical cords, etc? Yes  Adequate lighting in your home to reduce risk of falls? Yes   ASSISTIVE DEVICES UTILIZED TO PREVENT FALLS:  Life alert? No  Use of a cane, walker or w/c? Yes sometimes uses a cane Grab bars in the bathroom? Yes  Shower chair or bench in shower? No  Elevated toilet seat or a handicapped toilet? No   TIMED UP AND GO:  Was the test performed? No . .     Cognitive Function:     6CIT Screen 08/01/2019  What Year? 0 points  What month? 0 points  What time? 0 points  Count back from 20 0 points  Months in reverse 0 points  Repeat phrase 0 points  Total Score 0    Immunizations Immunization History  Administered Date(s) Administered  . Moderna SARS-COVID-2 Vaccination 03/21/2019, 04/18/2019  . Pneumococcal Conjugate-13 06/07/2019    TDAP status: Due, Education has been provided regarding the importance of this vaccine. Advised may receive this vaccine at local pharmacy or Health Dept. Aware to provide a copy of the vaccination record if obtained from local pharmacy or Health Dept. Verbalized acceptance and understanding. Flu Vaccine status: Declined, Education has been provided regarding the importance of this vaccine but patient still declined. Advised may receive this vaccine at local  pharmacy or Health Dept. Aware to provide a copy of the vaccination record if obtained from local pharmacy or Health Dept. Verbalized acceptance and understanding. Pneumococcal vaccine status: Up to date Covid-19 vaccine status: Completed vaccines  Qualifies for Shingles Vaccine? Yes   Zostavax completed No   Shingrix Completed?: No.    Education has been provided regarding the importance of this vaccine. Patient has been advised to call insurance company to determine out of pocket expense if they have not yet received this vaccine. Advised may also receive vaccine at local pharmacy or Health Dept. Verbalized acceptance and understanding.  Screening Tests Health Maintenance  Topic Date Due  . Hepatitis C Screening  Never done  . TETANUS/TDAP  Never done  . MAMMOGRAM  Never done  . DEXA SCAN  Never done  . INFLUENZA VACCINE  09/03/2019  . PNA vac Low Risk Adult (2 of 2 - PPSV23) 06/06/2020  . Fecal DNA (Cologuard)  06/20/2022  . COVID-19 Vaccine  Completed    Health Maintenance  Health Maintenance Due  Topic Date Due  . Hepatitis C Screening  Never done  . TETANUS/TDAP  Never done  . MAMMOGRAM  Never done  . DEXA SCAN  Never done    Colorectal cancer screening: Completed 06/20/2019. Repeat every 3 years Mammogram status: Ordered 05/23/2019 Bone Density status: Ordered 05/23/2019  Lung Cancer Screening: (Low Dose CT Chest recommended if Age 55-80 years, 30 pack-year currently smoking OR have quit w/in 15years.) does not qualify.   Lung Cancer Screening Referral: no  Additional Screening:  Hepatitis C Screening: does qualify  Vision Screening: Recommended annual ophthalmology exams for early detection of glaucoma and other disorders of the eye. Is the patient up to date with their annual eye exam?  Yes  Who is the provider or what is the name of the office in which the patient attends annual eye exams? Dr. Wyatt Portela If pt is not established with a provider, would they like to be  referred to a provider to establish care? No .   Dental Screening: Recommended annual dental exams for proper oral hygiene  Community Resource Referral / Chronic Care Management: CRR required this visit?  No   CCM required this visit?  No      Plan:     I have personally reviewed and noted the following in the patient's chart:   . Medical and social history . Use of alcohol, tobacco or illicit drugs  . Current medications and supplements . Functional ability and status . Nutritional status . Physical activity . Advanced directives . List of other physicians . Hospitalizations, surgeries, and ER visits in previous 12 months . Vitals . Screenings to include cognitive, depression, and falls . Referrals and appointments  In addition, I have reviewed and discussed with patient certain preventive protocols, quality metrics, and best practice recommendations. A written personalized care plan for preventive services as well as general preventive health recommendations were provided to patient.   Due to this being a telephonic visit, the after visit summary with patients personalized plan was offered to patient via mail or my-chart.   Kellie Simmering, LPN   08/17/9676   Nurse Notes: Patient needs Hep C screening and TDAP

## 2019-10-26 ENCOUNTER — Ambulatory Visit
Admission: RE | Admit: 2019-10-26 | Discharge: 2019-10-26 | Disposition: A | Discharge status: Home / Self Care | Payer: Medicare Other | Source: Ambulatory Visit | Attending: Family Medicine | Admitting: Family Medicine

## 2019-10-26 ENCOUNTER — Other Ambulatory Visit: Payer: Self-pay

## 2019-10-26 DIAGNOSIS — Z1231 Encounter for screening mammogram for malignant neoplasm of breast: Secondary | ICD-10-CM | POA: Diagnosis not present

## 2019-12-21 ENCOUNTER — Other Ambulatory Visit: Payer: Self-pay | Admitting: Family Medicine

## 2019-12-21 DIAGNOSIS — I10 Essential (primary) hypertension: Secondary | ICD-10-CM

## 2020-02-28 DIAGNOSIS — H01009 Unspecified blepharitis unspecified eye, unspecified eyelid: Secondary | ICD-10-CM | POA: Diagnosis not present

## 2020-03-29 ENCOUNTER — Other Ambulatory Visit: Payer: Self-pay

## 2020-03-29 DIAGNOSIS — I10 Essential (primary) hypertension: Secondary | ICD-10-CM

## 2020-04-02 MED ORDER — MELOXICAM 15 MG PO TABS: 15.0000 mg | ORAL_TABLET | Freq: Every day | ORAL | 0 refills | Status: DC

## 2020-06-12 ENCOUNTER — Other Ambulatory Visit: Payer: Self-pay

## 2020-06-12 DIAGNOSIS — E782 Mixed hyperlipidemia: Secondary | ICD-10-CM

## 2020-06-12 MED ORDER — ATORVASTATIN CALCIUM 40 MG PO TABS: 40.0000 mg | ORAL_TABLET | Freq: Every day | ORAL | 3 refills | Status: DC

## 2020-06-18 ENCOUNTER — Ambulatory Visit (INDEPENDENT_AMBULATORY_CARE_PROVIDER_SITE_OTHER): Payer: Medicare Other | Admitting: Internal Medicine

## 2020-06-18 ENCOUNTER — Encounter: Payer: Self-pay | Admitting: Internal Medicine

## 2020-06-18 ENCOUNTER — Other Ambulatory Visit: Payer: Self-pay

## 2020-06-18 VITALS — BP 113/58 | HR 68 | Temp 97.3°F | Resp 20 | Ht 60.0 in | Wt 159.8 lb

## 2020-06-18 DIAGNOSIS — E782 Mixed hyperlipidemia: Secondary | ICD-10-CM | POA: Diagnosis not present

## 2020-06-18 DIAGNOSIS — Z23 Encounter for immunization: Secondary | ICD-10-CM

## 2020-06-18 DIAGNOSIS — Z780 Asymptomatic menopausal state: Secondary | ICD-10-CM | POA: Diagnosis not present

## 2020-06-18 DIAGNOSIS — M170 Bilateral primary osteoarthritis of knee: Secondary | ICD-10-CM | POA: Diagnosis not present

## 2020-06-18 DIAGNOSIS — I10 Essential (primary) hypertension: Secondary | ICD-10-CM

## 2020-06-18 DIAGNOSIS — G4733 Obstructive sleep apnea (adult) (pediatric): Secondary | ICD-10-CM | POA: Diagnosis not present

## 2020-06-18 DIAGNOSIS — M1712 Unilateral primary osteoarthritis, left knee: Secondary | ICD-10-CM | POA: Insufficient documentation

## 2020-06-18 DIAGNOSIS — Z0001 Encounter for general adult medical examination with abnormal findings: Secondary | ICD-10-CM

## 2020-06-18 DIAGNOSIS — Z6831 Body mass index (BMI) 31.0-31.9, adult: Secondary | ICD-10-CM

## 2020-06-18 DIAGNOSIS — M1710 Unilateral primary osteoarthritis, unspecified knee: Secondary | ICD-10-CM | POA: Insufficient documentation

## 2020-06-18 DIAGNOSIS — Z1159 Encounter for screening for other viral diseases: Secondary | ICD-10-CM | POA: Diagnosis not present

## 2020-06-18 DIAGNOSIS — E6609 Other obesity due to excess calories: Secondary | ICD-10-CM

## 2020-06-18 NOTE — Assessment & Plan Note (Signed)
Not wearing a CPAP She reports she was never sent a machine

## 2020-06-18 NOTE — Assessment & Plan Note (Signed)
Encouraged weight loss as this will help reduce joint pain Continue Meloxicam and Voltaren gel CMET ordered- she will schedule a lab visit

## 2020-06-18 NOTE — Patient Instructions (Signed)

## 2020-06-18 NOTE — Assessment & Plan Note (Signed)
CMET and lipid profile ordered- she will schedule a lab visit Continue Atorvastatin Encouraged her to consume a low fat diet

## 2020-06-18 NOTE — Assessment & Plan Note (Signed)
Controlled on Amlodipine, Losartan HCT Reinforced DASH diet and exercise for weight loss CMET ordered- she will schedule a lab visit

## 2020-06-18 NOTE — Progress Notes (Signed)
 Subjective:    Patient ID: Darlene Mcdaniel, female    DOB: 03/12/1950, 70 y.o.   MRN: [id number]  HPI  Patient presents the clinic today for her annual exam.  She is also due to follow-up chronic conditions.  She is establishing care with me today, transferring call from Nicole Melfi, NP.  HTN: Her BP today is 113/58.  She is taking Amlodipine, Losartan HCT as prescribed.  There is no ECG on file.  HLD: Her last LDL was 131, triglycerides 61, 06/2019.  She denies myalgias on Atorvastatin.  She tries to consume a low-fat diet.  OSA: She averages 4-5 hours of sleep per night without the use of her CPAP. She reports she was never told she needed to wear the CPAP and she does not have machine. Sleep study from 07/2019 reviewed.  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 She is taking Meloxicam and using Voltaren Gel as needed with good relief of symptoms.   Flu: 11/2019 Tetanus: unsure COVID: Moderna x 3 Pneumovax: Never Prevnar: 06/2019 Zostavax: never Shingrix: never Mammogram: 10/2019 Pap smear: 06/2019 Bone density: never Colon screening: 06/2019, Cologuard Vision screening: annually Dentist: biannually  Diet: She does eat meat. She consumes fruits and veggies daily. She does eat some fried foods. She drinks mostly water, cranberry juice Exercise: Walking  Review of Systems      No past medical history on file.  Current Outpatient Medications  Medication Sig Dispense Refill  . amLODipine (NORVASC) 10 MG tablet Take 1 tablet (10 mg total) by mouth daily. 90 tablet 3  . atorvastatin (LIPITOR) 40 MG tablet Take 1 tablet (40 mg total) by mouth daily. 90 tablet 3  . Blood Pressure Monitoring (BLOOD PRESSURE KIT) DEVI 1 Units by Does not apply route 2 (two) times daily. 1 each 0  . diclofenac Sodium (VOLTAREN) 1 % GEL Apply 2 g topically 4 (four) times daily. 50 g 1  . losartan-hydrochlorothiazide (HYZAAR) 100-12.5 MG tablet Take 1 tablet by mouth daily. 90 tablet 3  . meloxicam (MOBIC) 15 MG  tablet Take 1 tablet (15 mg total) by mouth daily. 90 tablet 0   No current facility-administered medications for this visit.    No Known Allergies  Family History  Problem Relation Age of Onset  . Heart disease Mother   . Stroke Mother   . Diabetes Mother   . Cancer Mother   . Leukemia Mother   . Breast cancer Sister     Social History   Socioeconomic History  . Marital status: Single    Spouse name: Not on file  . Number of children: Not on file  . Years of education: Not on file  . Highest education level: Not on file  Occupational History  . Occupation: retired  Tobacco Use  . Smoking status: Current Every Day Smoker    Packs/day: 0.75    Years: 35.00    Pack years: 26.25    Types: Cigarettes  . Smokeless tobacco: Never Used  Vaping Use  . Vaping Use: Never used  Substance and Sexual Activity  . Alcohol use: Not Currently    Comment: occassionally  . Drug use: Never  . Sexual activity: Yes  Other Topics Concern  . Not on file  Social History Narrative  . Not on file   Social Determinants of Health   Financial Resource Strain: Low Risk   . Difficulty of Paying Living Expenses: Not hard at all  Food Insecurity: No Food Insecurity  . Worried About Running   Out of Food in the Last Year: Never true  . Ran Out of Food in the Last Year: Never true  Transportation Needs: No Transportation Needs  . Lack of Transportation (Medical): No  . Lack of Transportation (Non-Medical): No  Physical Activity: Inactive  . Days of Exercise per Week: 0 days  . Minutes of Exercise per Session: 0 min  Stress: No Stress Concern Present  . Feeling of Stress : Not at all  Social Connections: Not on file  Intimate Partner Violence: Not on file     Constitutional: Denies fever, malaise, fatigue, headache or abrupt weight changes.  HEENT: Denies eye pain, eye redness, ear pain, ringing in the ears, wax buildup, runny nose, nasal congestion, bloody nose, or sore  throat. Respiratory: Denies difficulty breathing, shortness of breath, cough or sputum production.   Cardiovascular: Denies chest pain, chest tightness, palpitations or swelling in the hands or feet.  Gastrointestinal: Denies abdominal pain, bloating, constipation, diarrhea or blood in the stool.  GU: Denies urgency, frequency, pain with urination, burning sensation, blood in urine, odor or discharge. Musculoskeletal: Pt reports joint pain in knees. Denies decrease in range of motion, difficulty with gait, muscle pain or joint  swelling.  Skin: Denies redness, rashes, lesions or ulcercations.  Neurological: Denies dizziness, difficulty with memory, difficulty with speech or problems with balance and coordination.  Psych: Denies anxiety, depression, SI/HI.  No other specific complaints in a complete review of systems (except as listed in HPI above).  Objective:   Physical Exam  BP (!) 113/58 (BP Location: Left Arm, Patient Position: Sitting, Cuff Size: Normal)   Pulse 68   Temp (!) 97.3 F (36.3 C) (Temporal)   Resp 20   Ht 5' (1.524 m)   Wt 159 lb 12.8 oz (72.5 kg)   SpO2 100%   BMI 31.21 kg/m   Wt Readings from Last 3 Encounters:  08/01/19 160 lb (72.6 kg)  07/19/19 159 lb 3.2 oz (72.2 kg)  07/05/19 157 lb 9.6 oz (71.5 kg)    General: Appears her stated age, obese, in NAD. Skin: Warm, dry and intact. No rashes noted. HEENT: Head: normal shape and size; Eyes: sclera white and EOMs intact;  Neck:  Neck supple, trachea midline. No masses, lumps or thyromegaly present.  Cardiovascular: Normal rate and rhythm. S1,S2 noted.  No murmur, rubs or gallops noted. No JVD or BLE edema. No carotid bruits noted. Pulmonary/Chest: Normal effort and positive vesicular breath sounds. No respiratory distress. No wheezes, rales or ronchi noted.  Abdomen: Soft and nontender. Normal bowel sounds. No distention or masses noted. Liver, spleen and kidneys non palpable. Musculoskeletal: Strength 5/5  BUE/BLE.  Limping gait. Neurological: Alert and oriented. Cranial nerves II-XII grossly intact. Coordination normal.  Psychiatric: Mood and affect normal. Behavior is normal. Judgment and thought content normal.     BMET    Component Value Date/Time   NA 142 05/23/2019 1110   K 4.0 05/23/2019 1110   CL 106 05/23/2019 1110   CO2 28 05/23/2019 1110   GLUCOSE 88 05/23/2019 1110   BUN 13 05/23/2019 1110   CREATININE 0.55 05/23/2019 1110   CALCIUM 9.1 05/23/2019 1110   GFRNONAA 98 05/23/2019 1110   GFRAA 113 05/23/2019 1110    Lipid Panel     Component Value Date/Time   CHOL 225 (H) 06/20/2019 0843   TRIG 194 (H) 06/20/2019 0843   HDL 61 06/20/2019 0843   CHOLHDL 3.7 06/20/2019 0843   LDLCALC 131 (H) 06/20/2019 0843      CBC    Component Value Date/Time   WBC 8.6 05/23/2019 1110   RBC 4.57 05/23/2019 1110   HGB 14.3 05/23/2019 1110   HCT 43.2 05/23/2019 1110   PLT 288 05/23/2019 1110   MCV 94.5 05/23/2019 1110   MCH 31.3 05/23/2019 1110   MCHC 33.1 05/23/2019 1110   RDW 13.4 05/23/2019 1110   LYMPHSABS 2,133 05/23/2019 1110   EOSABS 52 05/23/2019 1110   BASOSABS 52 05/23/2019 1110    Hgb A1C No results found for: HGBA1C          Assessment & Plan:    Preventative Health Maintenance:  Encouraged her to get a flu shot in fall Encouraged her to go get a tetanus shot if she gets cut/bitten Encouraged her to get her COVID booster Prevnar UTD Pneumovax today Encouraged her to get her Shingrix vaccine at the pharmacy Mammogram UTD Pap smear UTD Bone density ordered- she will schedule with her mammogram 10/2020 Colon screening UTD Encouraged her to consume a balanced diet and exercise regimen Advised her to see an eye doctor and dentist annually We will check CBC, c-Met, lipid, A1C and hep C today  RTC in 1 year for Medicare wellness exam Regina Baity, NP This visit occurred during the SARS-CoV-2 public health emergency.  Safety protocols were in place,  including screening questions prior to the visit, additional usage of staff PPE, and extensive cleaning of exam room while observing appropriate contact time as indicated for disinfecting solutions.    

## 2020-06-18 NOTE — Addendum Note (Signed)
Addended by: Lonna Cobb on: 06/18/2020 02:46 PM   Modules accepted: Orders

## 2020-06-19 ENCOUNTER — Other Ambulatory Visit: Payer: Medicare Other

## 2020-06-19 DIAGNOSIS — I10 Essential (primary) hypertension: Secondary | ICD-10-CM | POA: Diagnosis not present

## 2020-06-19 DIAGNOSIS — R7303 Prediabetes: Secondary | ICD-10-CM | POA: Diagnosis not present

## 2020-06-19 DIAGNOSIS — E782 Mixed hyperlipidemia: Secondary | ICD-10-CM

## 2020-06-19 DIAGNOSIS — Z0001 Encounter for general adult medical examination with abnormal findings: Secondary | ICD-10-CM

## 2020-06-19 DIAGNOSIS — Z1159 Encounter for screening for other viral diseases: Secondary | ICD-10-CM

## 2020-06-20 LAB — COMPREHENSIVE METABOLIC PANEL
AG Ratio: 1.5 (calc) (ref 1.0–2.5)
ALT: 12 U/L (ref 6–29)
AST: 11 U/L (ref 10–35)
Albumin: 4 g/dL (ref 3.6–5.1)
Alkaline phosphatase (APISO): 70 U/L (ref 37–153)
BUN: 19 mg/dL (ref 7–25)
CO2: 23 mmol/L (ref 20–32)
Calcium: 9 mg/dL (ref 8.6–10.4)
Chloride: 110 mmol/L (ref 98–110)
Creat: 0.87 mg/dL (ref 0.50–0.99)
Globulin: 2.7 g/dL (calc) (ref 1.9–3.7)
Glucose, Bld: 98 mg/dL (ref 65–99)
Potassium: 4.1 mmol/L (ref 3.5–5.3)
Sodium: 143 mmol/L (ref 135–146)
Total Bilirubin: 0.3 mg/dL (ref 0.2–1.2)
Total Protein: 6.7 g/dL (ref 6.1–8.1)

## 2020-06-20 LAB — CBC
HCT: 36.8 % (ref 35.0–45.0)
Hemoglobin: 11.9 g/dL (ref 11.7–15.5)
MCH: 30.5 pg (ref 27.0–33.0)
MCHC: 32.3 g/dL (ref 32.0–36.0)
MCV: 94.4 fL (ref 80.0–100.0)
MPV: 10.5 fL (ref 7.5–12.5)
Platelets: 324 10*3/uL (ref 140–400)
RBC: 3.9 10*6/uL (ref 3.80–5.10)
RDW: 13.1 % (ref 11.0–15.0)
WBC: 8.5 10*3/uL (ref 3.8–10.8)

## 2020-06-20 LAB — LIPID PANEL
Cholesterol: 168 mg/dL (ref <200)
HDL: 58 mg/dL (ref >=50)
LDL Cholesterol (Calc): 86 mg/dL (calc)
Non-HDL Cholesterol (Calc): 110 mg/dL (calc) (ref <130)
Total CHOL/HDL Ratio: 2.9 (calc) (ref <5.0)
Triglycerides: 147 mg/dL (ref <150)

## 2020-06-20 LAB — HEMOGLOBIN A1C
Hgb A1c MFr Bld: 6.1 % of total Hgb — ABNORMAL HIGH (ref <5.7)
Mean Plasma Glucose: 128 mg/dL
eAG (mmol/L): 7.1 mmol/L

## 2020-06-20 LAB — HEPATITIS C ANTIBODY
Hepatitis C Ab: NONREACTIVE
SIGNAL TO CUT-OFF: 0.01 (ref <1.00)

## 2020-06-26 ENCOUNTER — Telehealth: Payer: Self-pay

## 2020-06-26 NOTE — Telephone Encounter (Signed)
Patient scheduled for lung screening CT scan on June 16th at 1230. Gave patient address and phone number but she would also like a text with the appointment information. Her insurance is the same and she states that she smokes 6 cigarettes per day.

## 2020-06-30 ENCOUNTER — Other Ambulatory Visit: Payer: Self-pay | Admitting: Family Medicine

## 2020-06-30 DIAGNOSIS — I10 Essential (primary) hypertension: Secondary | ICD-10-CM

## 2020-06-30 NOTE — Telephone Encounter (Signed)
Requested Prescriptions  Pending Prescriptions Disp Refills  . meloxicam (MOBIC) 15 MG tablet [Pharmacy Med Name: MELOXICAM 15MG  TABLETS] 90 tablet 0    Sig: TAKE 1 TABLET(15 MG) BY MOUTH DAILY     Analgesics:  COX2 Inhibitors Passed - 06/30/2020  3:21 AM      Passed - HGB in normal range and within 360 days    Hemoglobin  Date Value Ref Range Status  06/19/2020 11.9 11.7 - 15.5 g/dL Final         Passed - Cr in normal range and within 360 days    Creat  Date Value Ref Range Status  06/19/2020 0.87 0.50 - 0.99 mg/dL Final    Comment:    For patients >43 years of age, the reference limit for Creatinine is approximately 13% higher for people identified as African-American. 54 - Patient is not pregnant      Passed - Valid encounter within last 12 months    Recent Outpatient Visits          1 week ago Encounter for general adult medical examination with abnormal findings   [facility] [place], [place], NP   11 months ago Obstructive sleep apnea   [facility], [facility], FNP   12 months ago Hypertension, unspecified type   [facility], [facility], FNP   1 year ago Screening for lipid disorders   [facility], [facility], FNP   1 year ago Hypertension, unspecified type   [facility], [facility], FNP      Future Appointments            In 11 months Baity, Jodelle Gross, NP [facility], [facility]

## 2020-07-02 ENCOUNTER — Other Ambulatory Visit: Payer: Self-pay | Admitting: *Deleted

## 2020-07-02 DIAGNOSIS — Z122 Encounter for screening for malignant neoplasm of respiratory organs: Secondary | ICD-10-CM

## 2020-07-02 DIAGNOSIS — F17200 Nicotine dependence, unspecified, uncomplicated: Secondary | ICD-10-CM

## 2020-07-02 DIAGNOSIS — Z87891 Personal history of nicotine dependence: Secondary | ICD-10-CM

## 2020-07-02 NOTE — Progress Notes (Signed)
Contacted and scheduled for annual lung screening scan. Patient is a current smoker with a 46.25 pack year history.  

## 2020-07-18 ENCOUNTER — Ambulatory Visit
Admission: RE | Admit: 2020-07-18 | Discharge: 2020-07-18 | Disposition: A | Discharge status: Home / Self Care | Payer: Medicare Other | Source: Ambulatory Visit | Attending: Nurse Practitioner | Admitting: Nurse Practitioner

## 2020-07-18 ENCOUNTER — Other Ambulatory Visit: Payer: Self-pay

## 2020-07-18 DIAGNOSIS — F17210 Nicotine dependence, cigarettes, uncomplicated: Secondary | ICD-10-CM | POA: Diagnosis not present

## 2020-07-18 DIAGNOSIS — Z87891 Personal history of nicotine dependence: Secondary | ICD-10-CM | POA: Diagnosis not present

## 2020-07-18 DIAGNOSIS — Z122 Encounter for screening for malignant neoplasm of respiratory organs: Secondary | ICD-10-CM | POA: Diagnosis not present

## 2020-07-18 DIAGNOSIS — F17200 Nicotine dependence, unspecified, uncomplicated: Secondary | ICD-10-CM | POA: Insufficient documentation

## 2020-08-07 ENCOUNTER — Encounter: Payer: Self-pay | Admitting: *Deleted

## 2020-08-21 ENCOUNTER — Other Ambulatory Visit: Payer: Self-pay | Admitting: Internal Medicine

## 2020-08-21 DIAGNOSIS — I10 Essential (primary) hypertension: Secondary | ICD-10-CM

## 2020-09-27 ENCOUNTER — Other Ambulatory Visit: Payer: Self-pay | Admitting: Internal Medicine

## 2020-09-27 DIAGNOSIS — I10 Essential (primary) hypertension: Secondary | ICD-10-CM

## 2020-09-30 ENCOUNTER — Other Ambulatory Visit: Payer: Self-pay | Admitting: Internal Medicine

## 2020-09-30 DIAGNOSIS — I10 Essential (primary) hypertension: Secondary | ICD-10-CM

## 2020-09-30 MED ORDER — AMLODIPINE BESYLATE 10 MG PO TABS: 10.0000 mg | ORAL_TABLET | Freq: Every day | ORAL | 2 refills | Status: DC

## 2020-09-30 NOTE — Telephone Encounter (Signed)
Medication Refill - Medication: Amlodipine   Has the patient contacted their pharmacy? Yes.   Pt called stating that she contacted pharmacy and that they state they cannot refill prescription due to not having authorization from PCP. Please advise.  (Agent: If no, request that the patient contact the pharmacy for the refill.) (Agent: If yes, when and what did the pharmacy advise?)  Preferred Pharmacy (with phone number or street name):  [id number] [place], [place] - [address] AT [facility] OF [address] & [address] [facility]  [address] [place] [address]  Phone: [phone] Fax: [phone]  Hours: Not open 24 hours    Agent: Please be advised that RX refills may take up to 3 business days. We ask that you follow-up with your pharmacy.

## 2020-09-30 NOTE — Telephone Encounter (Signed)
Requested Prescriptions  Pending Prescriptions Disp Refills  . amLODipine (NORVASC) 10 MG tablet 90 tablet 2    Sig: Take 1 tablet (10 mg total) by mouth daily.     Cardiovascular:  Calcium Channel Blockers Passed - 09/30/2020 12:20 PM      Passed - Last BP in normal range    BP Readings from Last 1 Encounters:  06/18/20 (!) 113/58         Passed - Valid encounter within last 6 months    Recent Outpatient Visits          3 months ago Encounter for general adult medical examination with abnormal findings   [facility] [place], Salvadore Oxford, NP   1 year ago Obstructive sleep apnea   [facility], Jodelle Gross, FNP   1 year ago Hypertension, unspecified type   [facility], Jodelle Gross, FNP   1 year ago Screening for lipid disorders   [facility], Jodelle Gross, FNP   1 year ago Hypertension, unspecified type   [facility], Jodelle Gross, FNP      Future Appointments            In 8 months Baity, Salvadore Oxford, NP [facility], [facility]

## 2020-10-03 ENCOUNTER — Other Ambulatory Visit: Payer: Self-pay

## 2020-12-02 ENCOUNTER — Other Ambulatory Visit: Payer: Self-pay | Admitting: Internal Medicine

## 2020-12-02 DIAGNOSIS — Z1231 Encounter for screening mammogram for malignant neoplasm of breast: Secondary | ICD-10-CM

## 2020-12-23 ENCOUNTER — Other Ambulatory Visit: Payer: Self-pay | Admitting: Internal Medicine

## 2020-12-23 DIAGNOSIS — I10 Essential (primary) hypertension: Secondary | ICD-10-CM

## 2020-12-23 NOTE — Telephone Encounter (Signed)
Requested Prescriptions  Pending Prescriptions Disp Refills  . meloxicam (MOBIC) 15 MG tablet [Pharmacy Med Name: MELOXICAM 15MG  TABLETS] 90 tablet 0    Sig: TAKE 1 TABLET(15 MG) BY MOUTH DAILY     Analgesics:  COX2 Inhibitors Passed - 12/23/2020  3:16 AM      Passed - HGB in normal range and within 360 days    Hemoglobin  Date Value Ref Range Status  06/19/2020 11.9 11.7 - 15.5 g/dL Final         Passed - Cr in normal range and within 360 days    Creat  Date Value Ref Range Status  06/19/2020 0.87 0.50 - 0.99 mg/dL Final    Comment:    For patients >87 years of age, the reference limit for Creatinine is approximately 13% higher for people identified as African-American. 54 - Patient is not pregnant      Passed - Valid encounter within last 12 months    Recent Outpatient Visits          6 months ago Encounter for general adult medical examination with abnormal findings   [facility], [place], NP   1 year ago Obstructive sleep apnea   [facility], [facility], FNP   1 year ago Hypertension, unspecified type   [facility], [facility], FNP   1 year ago Screening for lipid disorders   [facility], [facility], FNP   1 year ago Hypertension, unspecified type   [facility], [facility], FNP      Future Appointments            In 5 months Baity, Jodelle Gross, NP [facility], [facility]

## 2020-12-31 ENCOUNTER — Other Ambulatory Visit: Payer: Self-pay

## 2020-12-31 ENCOUNTER — Ambulatory Visit
Admission: RE | Admit: 2020-12-31 | Discharge: 2020-12-31 | Disposition: A | Discharge status: Home / Self Care | Payer: Medicare Other | Source: Ambulatory Visit | Attending: Internal Medicine | Admitting: Internal Medicine

## 2020-12-31 DIAGNOSIS — Z1231 Encounter for screening mammogram for malignant neoplasm of breast: Secondary | ICD-10-CM | POA: Diagnosis not present

## 2021-02-13 ENCOUNTER — Ambulatory Visit (INDEPENDENT_AMBULATORY_CARE_PROVIDER_SITE_OTHER): Payer: Medicare Other | Admitting: Internal Medicine

## 2021-02-13 ENCOUNTER — Other Ambulatory Visit: Payer: Self-pay

## 2021-02-13 ENCOUNTER — Ambulatory Visit
Admission: RE | Admit: 2021-02-13 | Discharge: 2021-02-13 | Disposition: A | Discharge status: Home / Self Care | Payer: Medicare Other | Source: Ambulatory Visit | Attending: Internal Medicine | Admitting: Internal Medicine

## 2021-02-13 ENCOUNTER — Ambulatory Visit
Admission: RE | Admit: 2021-02-13 | Discharge: 2021-02-13 | Disposition: A | Discharge status: Home / Self Care | Payer: Medicare Other | Attending: Internal Medicine | Admitting: Internal Medicine

## 2021-02-13 ENCOUNTER — Encounter: Payer: Self-pay | Admitting: Internal Medicine

## 2021-02-13 VITALS — BP 98/66 | HR 60 | Temp 97.9°F | Resp 18 | Ht 60.0 in | Wt 161.0 lb

## 2021-02-13 DIAGNOSIS — E6609 Other obesity due to excess calories: Secondary | ICD-10-CM | POA: Diagnosis not present

## 2021-02-13 DIAGNOSIS — M25562 Pain in left knee: Secondary | ICD-10-CM

## 2021-02-13 DIAGNOSIS — G8929 Other chronic pain: Secondary | ICD-10-CM

## 2021-02-13 DIAGNOSIS — Z6831 Body mass index (BMI) 31.0-31.9, adult: Secondary | ICD-10-CM | POA: Diagnosis not present

## 2021-02-13 DIAGNOSIS — R600 Localized edema: Secondary | ICD-10-CM | POA: Diagnosis not present

## 2021-02-13 NOTE — Progress Notes (Signed)
Subjective:    Patient ID: Darlene Mcdaniel, female    DOB: April 05, 1950, 71 y.o.   MRN: [id number]  HPI  Pt presents to the clinic today with c/o left knee pain. This started years ago but has worsened in the last 2-3 weeks. She describes the pain as achy, sharp and shooting. The pain does not radiate. She denies numbness, tingling or weakness or her LLE. The pain seems worse at night, often waking her up from sleep. She does have a history of arthritis, but there is no imaging on file. She denies any injury to the area. She reports she walks with a cane at home and rides motorized scooters when she goes out to the store. She takes Meloxicam and Tylenol as prescribed with some relief of symptoms. She also would like to know if she can get a handicap placard.  Review of Systems     No past medical history on file.  Current Outpatient Medications  Medication Sig Dispense Refill   amLODipine (NORVASC) 10 MG tablet Take 1 tablet (10 mg total) by mouth daily. 90 tablet 2   atorvastatin (LIPITOR) 40 MG tablet Take 1 tablet (40 mg total) by mouth daily. 90 tablet 3   Blood Pressure Monitoring (BLOOD PRESSURE KIT) DEVI 1 Units by Does not apply route 2 (two) times daily. 1 each 0   cetirizine (ZYRTEC) 10 MG tablet Take 10 mg by mouth daily.     diclofenac Sodium (VOLTAREN) 1 % GEL Apply 2 g topically 4 (four) times daily. 50 g 1   losartan-hydrochlorothiazide (HYZAAR) 100-12.5 MG tablet TAKE 1 TABLET BY MOUTH DAILY 90 tablet 3   meloxicam (MOBIC) 15 MG tablet TAKE 1 TABLET(15 MG) BY MOUTH DAILY 90 tablet 0   No current facility-administered medications for this visit.    No Known Allergies  Family History  Problem Relation Age of Onset   Heart disease Mother    Stroke Mother    Diabetes Mother    Cancer Mother    Leukemia Mother    Breast cancer Sister     Social History   Socioeconomic History   Marital status: Single    Spouse name: Not on file   Number of children: Not on file    Years of education: Not on file   Highest education level: Not on file  Occupational History   Occupation: retired  Tobacco Use   Smoking status: Every Day    Packs/day: 0.75    Years: 35.00    Pack years: 26.25    Types: Cigarettes   Smokeless tobacco: Never  Vaping Use   Vaping Use: Never used  Substance and Sexual Activity   Alcohol use: Not Currently    Comment: occassionally   Drug use: Never   Sexual activity: Yes  Other Topics Concern   Not on file  Social History Narrative   Not on file   Social Determinants of Health   Financial Resource Strain: Not on file  Food Insecurity: Not on file  Transportation Needs: Not on file  Physical Activity: Not on file  Stress: Not on file  Social Connections: Not on file  Intimate Partner Violence: Not on file     Constitutional: Denies fever, malaise, fatigue, headache or abrupt weight changes.  Respiratory: Denies difficulty breathing, shortness of breath, cough or sputum production.   Cardiovascular: Denies chest pain, chest tightness, palpitations or swelling in the hands or feet.  Musculoskeletal: Pt reports left knee pain, difficulty with gait. Denies  decrease in range of motion, muscle pain or joint swelling.  Skin: Denies redness, rashes, lesions or ulcercations.  Neurological: Denies numbness, tingling, weakness or problems with balance and coordination.  Psych: Denies anxiety, depression, SI/HI.  No other specific complaints in a complete review of systems (except as listed in HPI above).  Objective:   Physical Exam  BP 98/66 (BP Location: Right Arm, Patient Position: Sitting, Cuff Size: Normal)    Pulse 60    Temp 97.9 F (36.6 C) (Temporal)    Resp 18    Ht 5' (1.524 m)    Wt 161 lb (73 kg)    SpO2 100%    BMI 31.44 kg/m   Wt Readings from Last 3 Encounters:  07/18/20 159 lb (72.1 kg)  06/18/20 159 lb 12.8 oz (72.5 kg)  08/01/19 160 lb (72.6 kg)    General: Appears her stated age, obese, in  NAD. Cardiovascular: Normal rate and rhythm.  Pulmonary/Chest: Normal effort and positive vesicular breath sounds. No respiratory distress. No wheezes, rales or ronchi noted.  Musculoskeletal: Normal flexion and extension of the left knee. No pain with palpation of the left knee. No signs of joint swelling. Negative Lachman's. Negative McMurray. Limping gait.  Neurological: Alert and oriented.   BMET    Component Value Date/Time   NA 143 06/19/2020 0801   K 4.1 06/19/2020 0801   CL 110 06/19/2020 0801   CO2 23 06/19/2020 0801   GLUCOSE 98 06/19/2020 0801   BUN 19 06/19/2020 0801   CREATININE 0.87 06/19/2020 0801   CALCIUM 9.0 06/19/2020 0801   GFRNONAA 98 05/23/2019 1110   GFRAA 113 05/23/2019 1110    Lipid Panel     Component Value Date/Time   CHOL 168 06/19/2020 0801   TRIG 147 06/19/2020 0801   HDL 58 06/19/2020 0801   CHOLHDL 2.9 06/19/2020 0801   LDLCALC 86 06/19/2020 0801    CBC    Component Value Date/Time   WBC 8.5 06/19/2020 0801   RBC 3.90 06/19/2020 0801   HGB 11.9 06/19/2020 0801   HCT 36.8 06/19/2020 0801   PLT 324 06/19/2020 0801   MCV 94.4 06/19/2020 0801   MCH 30.5 06/19/2020 0801   MCHC 32.3 06/19/2020 0801   RDW 13.1 06/19/2020 0801   LYMPHSABS 2,133 05/23/2019 1110   EOSABS 52 05/23/2019 1110   BASOSABS 52 05/23/2019 1110    Hgb A1C Lab Results  Component Value Date   HGBA1C 6.1 (H) 06/19/2020            Assessment & Plan:   Chronic Left Knee Pain:  Xray left knee today Continue Meloxicam and Tylenol Consider referral to orthopedics Will consider handicap placard once xray is reviewed  Will follow up after imaging with further recommendation and treatment plan Webb Silversmith, NP This visit occurred during the SARS-CoV-2 public health emergency.  Safety protocols were in place, including screening questions prior to the visit, additional usage of staff PPE, and extensive cleaning of exam room while observing appropriate contact  time as indicated for disinfecting solutions.

## 2021-02-13 NOTE — Patient Instructions (Signed)
Exercises for Chronic Knee Pain °Chronic knee pain is pain that lasts longer than 3 months. For most people with chronic knee pain, exercise and weight loss is an important part of treatment. Your health care provider may want you to focus on: °Strengthening the muscles that support your knee. This can take pressure off your knee and lessen pain. °Preventing knee stiffness. °Maintaining or increasing how far you can move your knee. °Losing weight (if this applies) to take pressure off your knee, decrease your risk for injury, and make it easier for you to exercise. °Your health care provider will help you develop an exercise program that matches your needs and physical abilities. Below are simple, low-impact exercises you can do at home. Ask your health care provider or a physical therapist how often you should do your exercise program and how many times to repeat each exercise. °General safety tips °Follow these safety tips for exercising with chronic knee pain: °Get your health care provider's approval before doing any exercises. °Start slowly and stop any time an exercise causes pain. °Do not exercise if your knee pain is flaring up. °Warm up first. Stretching a cold muscle can cause an injury. Do 5-10 minutes of easy movement or light stretching before beginning your exercise routine. °Do 5-10 minutes of low-impact activity (like walking or cycling) before starting strengthening exercises. °Contact your health care provider any time you have pain during or after exercising. Exercise may cause discomfort but should not be painful. It is normal to be a little stiff or sore after exercising. ° °Stretching and range-of-motion exercises °Front thigh stretch ° °Stand up straight and support your body by holding on to a chair or resting one hand on a wall. °With your legs straight and close together, bend one knee to lift your heel up toward your buttocks. °Using one hand for support, grab your ankle with your free  hand. °Pull your foot up closer toward your buttocks to feel the stretch in front of your thigh. °Hold the stretch for 30 seconds. °Repeat __________ times. Complete this exercise __________ times a day. °Back thigh stretch ° °Sit on the floor with your back straight and your legs out straight in front of you. °Place the palms of your hands on the floor and slide them toward your feet as you bend at the hip. °Try to touch your nose to your knees and feel the stretch in the back of your thighs. °Hold for 30 seconds. °Repeat __________ times. Complete this exercise __________ times a day. °Calf stretch ° °Stand facing a wall. °Place the palms of your hands flat against the wall, arms extended, and lean slightly against the wall. °Get into a lunge position with one leg bent at the knee and the other leg stretched out straight behind you. °Keep both feet facing the wall and increase the bend in your knee while keeping the heel of the other leg flat on the ground. °You should feel the stretch in your calf. Hold for 30 seconds. °Repeat __________ times. Complete this exercise __________ times a day. °Strengthening exercises °Straight leg lift °Lie on your back with one knee bent and the other leg out straight. °Slowly lift the straight leg without bending the knee. °Lift until your foot is about 12 inches (30 cm) off the floor. °Hold for 3-5 seconds and slowly lower your leg. °Repeat __________ times. Complete this exercise __________ times a day. °Single leg dip °Stand between two chairs and put both hands on the   backs of the chairs for support. °Extend one leg out straight with your body weight resting on the heel of the standing leg. °Slowly bend your standing knee to dip your body to the level that is comfortable for you. °Hold for 3-5 seconds. °Repeat __________ times. Complete this exercise __________ times a day. °Hamstring curls °Stand straight, knees close together, facing the back of a chair. °Hold on to the  back of a chair with both hands. °Keep one leg straight. Bend the other knee while bringing the heel up toward the buttock until the knee is bent at a 90-degree angle (right angle). °Hold for 3-5 seconds. °Repeat __________ times. Complete this exercise __________ times a day. °Wall squat °Stand straight with your back, hips, and head against a wall. °Step forward one foot at a time with your back still against the wall. °Your feet should be 2 feet (61 cm) from the wall at shoulder width. Keeping your back, hips, and head against the wall, slide down the wall to as close of a sitting position as you can get. °Hold for 5-10 seconds, then slowly slide back up. °Repeat __________ times. Complete this exercise __________ times a day. °Step-ups °Step up with one foot onto a sturdy platform or stool that is about 6 inches (15 cm) high. °Face sideways with one foot on the platform and one on the ground. °Place all your weight on the platform foot and lift your body off the ground until your knee extends. °Let your other leg hang free to the side. °Hold for 3-5 seconds then slowly lower your weight down to the floor foot. °Repeat __________ times. Complete this exercise __________ times a day. °Contact a health care provider if: °Your exercise causes pain. °Your pain is worse after you exercise. °Your pain prevents you from doing your exercises. °This information is not intended to replace advice given to you by your health care provider. Make sure you discuss any questions you have with your health care provider. °Document Revised: 05/25/2019 Document Reviewed: 01/16/2019 °Elsevier Patient Education © 2022 [employer] ° °

## 2021-02-13 NOTE — Assessment & Plan Note (Signed)
Encouraged weight loss as this can help reduce joint pain 

## 2021-02-14 DIAGNOSIS — H25013 Cortical age-related cataract, bilateral: Secondary | ICD-10-CM | POA: Diagnosis not present

## 2021-02-18 ENCOUNTER — Other Ambulatory Visit: Payer: Self-pay

## 2021-02-18 DIAGNOSIS — M1712 Unilateral primary osteoarthritis, left knee: Secondary | ICD-10-CM

## 2021-02-18 NOTE — Progress Notes (Signed)
Error

## 2021-02-24 ENCOUNTER — Ambulatory Visit (INDEPENDENT_AMBULATORY_CARE_PROVIDER_SITE_OTHER): Payer: Medicare Other | Admitting: Family Medicine

## 2021-02-24 ENCOUNTER — Other Ambulatory Visit: Payer: Self-pay

## 2021-02-24 ENCOUNTER — Inpatient Hospital Stay (INDEPENDENT_AMBULATORY_CARE_PROVIDER_SITE_OTHER): Payer: Medicare Other | Admitting: Radiology

## 2021-02-24 ENCOUNTER — Encounter: Payer: Self-pay | Admitting: Family Medicine

## 2021-02-24 VITALS — BP 112/74 | HR 65 | Ht 60.0 in | Wt 159.0 lb

## 2021-02-24 DIAGNOSIS — M1712 Unilateral primary osteoarthritis, left knee: Secondary | ICD-10-CM

## 2021-02-24 DIAGNOSIS — I10 Essential (primary) hypertension: Secondary | ICD-10-CM | POA: Diagnosis not present

## 2021-02-24 MED ORDER — TRIAMCINOLONE ACETONIDE 40 MG/ML IJ SUSP
80.0000 mg | Freq: Once | INTRAMUSCULAR | Status: AC
  Administered 2021-02-24: 80 mg via INTRAMUSCULAR

## 2021-02-24 MED ORDER — TRIAMCINOLONE ACETONIDE 40 MG/ML IJ SUSP: 40.0000 mg | Freq: Once | INTRAMUSCULAR | Status: DC

## 2021-02-24 MED ORDER — MELOXICAM 15 MG PO TABS: 15.0000 mg | ORAL_TABLET | Freq: Every day | ORAL | 0 refills | Status: DC | PRN

## 2021-02-24 NOTE — Patient Instructions (Signed)
You have just been given a cortisone injection to reduce pain and inflammation. After the injection you may notice immediate relief of pain as a result of the Lidocaine. It is important to rest the area of the injection for 24 to 48 hours after the injection. There is a possibility of some temporary increased discomfort and swelling for up to 72 hours until the cortisone begins to work. If you do have pain, simply rest the joint and use ice. If you can tolerate over the counter medications, you can try Tylenol, Aleve, or Advil for added relief per package instructions. - Continue meloxicam once daily on an as-needed basis, take until symptoms respond to cortisone - Use knee brace while on your feet and active, okay to remove for sleeping, bathing, periods of prolonged inactivity - Start home exercises with information provided at the 1 week mark, continue on a regular basis until follow-up - Return for follow-up in 4 weeks

## 2021-02-24 NOTE — Progress Notes (Signed)
Primary Care / Sports Medicine Office Visit  Patient Information:  Patient ID: Darlene Mcdaniel, female DOB: [birth year] Age: 71 y.o. MRN: [id number]   Darlene Mcdaniel is a pleasant 71 y.o. female presenting with the following:  Chief Complaint  Patient presents with   New Patient (Initial Visit)   Osteoarthritis of left knee    Chronic, has become more severe in the past couple of weeks; X-Ray 02/13/21; no known injury or trauma; describes pain as constant, stabbing; treatments include Tylenol arthritis, meloxicam, and diclofenac topical gel along with resting; 5/10 pain    Vitals:   02/24/21 1046  BP: 112/74  Pulse: 65  SpO2: 97%   Vitals:   02/24/21 1046  Weight: 159 lb (72.1 kg)  Height: 5' (1.524 m)   Body mass index is 31.05 kg/m.  DG Knee Complete 4 Views Left  Result Date: 02/13/2021 CLINICAL DATA:  Chronic left knee pain. EXAM: LEFT KNEE - COMPLETE 4+ VIEW COMPARISON:  None. FINDINGS: Valgus angulation. Narrowing of the medial tibiofemoral joint space. Lateral patellar tilt. Tricompartmental peripheral osteophytes, most prominently affecting the patellofemoral compartment. No significant knee joint effusion. No fracture, erosion, or focal bone abnormality. Mild prepatellar soft tissue edema. IMPRESSION: 1. Tricompartmental osteoarthritis, most prominently affecting the patellofemoral compartment. 2. Valgus angulation. Lateral patellar tilt. Electronically Signed   By: Narda Rutherford M.D.   On: 02/13/2021 15:02     Independent interpretation of notes and tests performed by another provider:   Independent interpretation of left knee x-ray dated 02/13/2021 reveals severe medial tibiofemoral narrowing, secondarily to the patellofemoral compartment with osteophytes, no acute osseous process noted  Procedures performed:   Procedure:  Injection of left knee joint under ultrasound guidance. Ultrasound guidance utilized, visualized no effusion on the left knee, the medial  tibiofemoral articulation was noted, injection performed successfully Samsung HS60 device utilized with permanent recording / reporting. Verbal informed consent obtained and verified. Skin prepped in a sterile fashion. Ethyl chloride for topical local analgesia.  Completed without difficulty and tolerated well. Medication: triamcinolone acetonide 40 mg/mL suspension for injection 1 mL total and 2 mL lidocaine 1% without epinephrine utilized for needle placement anesthetic Advised to contact for fevers/chills, erythema, induration, drainage, or persistent bleeding.   Pertinent History, Exam, Impression, and Recommendations:   Primary osteoarthritis of left knee Left knee diffuse pain, chronic condition with symptoms ongoing for roughly 1 year, somewhat mild in nature with severe and progressive worsening over the past several weeks.  Denies any trauma or change in activity at the onset of her severe pain.  She has been dosing meloxicam daily with some improvement.  Pain is worse during bedtime/sleeping, additionally from motion after period of immobility (getting up after sitting for a while).  She denies any swelling, no radiation of symptoms, no mechanical catching or locking, mild instability and buckling/near buckling episodes occurring roughly weekly.  Examination shows knee in a state of baseline valgus, tenderness about the medial patellar facet, medial joint line, severe pain with flexion to 110 degrees, medial McMurray positive, contralateral benign, valgus can be corrected during stressing, a firm endpoint is noted, no overt laxity noticed during varus stressing.  No anterior/posterior laxity during drawer testing.  Patient's clinical findings and x-rays are most consistent with tricompartmental osteoarthritis with maximal focality to the patellofemoral and medial tibiofemoral articulations.  Given the chronicity of symptoms, persistent symptoms despite treatment to date, we reviewed  additional treatment strategies and she did elect to proceed with ultrasound-guided corticosteroid injection.  Additionally, I have advised a transition to as needed dosing of meloxicam in regards to medication management, home exercises, lastly to utilize a hinged knee brace while ambulatory.  She will return in 4 weeks for reevaluation, we will additionally seek authorization for viscosupplementation over the interim.   Orders & Medications Meds ordered this encounter  Medications   meloxicam (MOBIC) 15 MG tablet    Sig: Take 1 tablet (15 mg total) by mouth daily as needed for pain.    Dispense:  30 tablet    Refill:  0   DISCONTD: triamcinolone acetonide (KENALOG-40) injection 40 mg   triamcinolone acetonide (KENALOG-40) injection 80 mg   Orders Placed This Encounter  Procedures   [place] LIMITED JOINT SPACE STRUCTURES LOW LEFT     Return in about 4 weeks (around 03/24/2021).     Jerrol Banana, MD   Primary Care Sports Medicine [facility]

## 2021-02-24 NOTE — Assessment & Plan Note (Addendum)
Left knee diffuse pain, chronic condition with symptoms ongoing for roughly 1 year, somewhat mild in nature with severe and progressive worsening over the past several weeks.  Denies any trauma or change in activity at the onset of her severe pain.  She has been dosing meloxicam daily with some improvement.  Pain is worse during bedtime/sleeping, additionally from motion after period of immobility (getting up after sitting for a while).  She denies any swelling, no radiation of symptoms, no mechanical catching or locking, mild instability and buckling/near buckling episodes occurring roughly weekly.  Examination shows knee in a state of baseline valgus, tenderness about the medial patellar facet, medial joint line, severe pain with flexion to 110 degrees, medial McMurray positive, contralateral benign, valgus can be corrected during stressing, a firm endpoint is noted, no overt laxity noticed during varus stressing.  No anterior/posterior laxity during drawer testing.  Patient's clinical findings and x-rays are most consistent with tricompartmental osteoarthritis with maximal focality to the patellofemoral and medial tibiofemoral articulations.  Given the chronicity of symptoms, persistent symptoms despite treatment to date, we reviewed additional treatment strategies and she did elect to proceed with ultrasound-guided corticosteroid injection.  Additionally, I have advised a transition to as needed dosing of meloxicam in regards to medication management, home exercises, lastly to utilize a hinged knee brace while ambulatory.  She will return in 4 weeks for reevaluation, we will additionally seek authorization for viscosupplementation over the interim.

## 2021-03-05 ENCOUNTER — Ambulatory Visit: Payer: Self-pay | Admitting: *Deleted

## 2021-03-05 NOTE — Telephone Encounter (Signed)
Third attempt to reach patient for triage- no answer and mailbox is full.

## 2021-03-05 NOTE — Telephone Encounter (Signed)
Second attempt to contact patient- no answer- mailbox full can not accept messages

## 2021-03-05 NOTE — Telephone Encounter (Signed)
Summary: Overactive bladder   Patient would like to know if she can take anything for an Overactive bladder, patient states it has worsen in the past week. She is unable to wash dishes without urinating on herself. Patient unsure if there's a OTC or will an appt be needed    Attempted to call patient- no answer and mailbox is full- unable to leave message

## 2021-03-05 NOTE — Telephone Encounter (Signed)
° ° °  Chief Complaint: "Overactive bladder" Symptoms: Bladder leakage Frequency: Started 2 weeks ago Pertinent Negatives: Patient denies any pain or burning Disposition: [] ED /[] Urgent Care (no appt availability in office) / [x] Appointment(In office/virtual)/ []  [facility]/ [] Home Care/ [] Refused Recommended Disposition /[] [facility]/ []  Follow-up with PCP Additional Notes: Pt. Asking to be worked in this week if possible. Please advise.  Reason for Disposition  [1] Can't control passage of urine (i.e., urinary incontinence, wetting self) AND [2] present > 2 weeks  Answer Assessment - Initial Assessment Questions 1. SYMPTOM: "What's the main symptom you're concerned about?" (e.g., frequency, incontinence)     Over active bladder, leakage 2. ONSET: "When did the  symptoms  start?"     Started 2 weeks ago 3. PAIN: "Is there any pain?" If Yes, ask: "How bad is it?" (Scale: 1-10; mild, moderate, severe)     No 4. CAUSE: "What do you think is causing the symptoms?"     Unsure 5. OTHER SYMPTOMS: "Do you have any other symptoms?" (e.g., fever, flank pain, blood in urine, pain with urination)     No 6. PREGNANCY: "Is there any chance you are pregnant?" "When was your last menstrual period?"     No  Protocols used: Urinary Symptoms-A-AH

## 2021-03-06 NOTE — Telephone Encounter (Signed)
FYI.... Pt has an appointment for 03/10/2021.   Thanks,   -Mickel Baas

## 2021-03-06 NOTE — Telephone Encounter (Signed)
noted 

## 2021-03-10 ENCOUNTER — Ambulatory Visit (INDEPENDENT_AMBULATORY_CARE_PROVIDER_SITE_OTHER): Payer: Medicare Other | Admitting: Internal Medicine

## 2021-03-10 ENCOUNTER — Other Ambulatory Visit: Payer: Self-pay

## 2021-03-10 ENCOUNTER — Encounter: Payer: Self-pay | Admitting: Internal Medicine

## 2021-03-10 VITALS — BP 106/66 | HR 82 | Temp 96.9°F | Wt 159.0 lb

## 2021-03-10 DIAGNOSIS — N3946 Mixed incontinence: Secondary | ICD-10-CM

## 2021-03-10 LAB — POCT URINALYSIS DIPSTICK
Bilirubin, UA: NEGATIVE
Blood, UA: NEGATIVE
Glucose, UA: NEGATIVE
Ketones, UA: NEGATIVE
Leukocytes, UA: NEGATIVE
Nitrite, UA: NEGATIVE
Protein, UA: NEGATIVE
Spec Grav, UA: 1.015 (ref 1.010–1.025)
Urobilinogen, UA: 0.2 E.U./dL
pH, UA: 5 (ref 5.0–8.0)

## 2021-03-10 MED ORDER — OXYBUTYNIN CHLORIDE ER 5 MG PO TB24: 5.0000 mg | ORAL_TABLET | Freq: Every day | ORAL | 2 refills | Status: DC

## 2021-03-10 NOTE — Progress Notes (Signed)
Subjective:    Patient ID: Darlene Mcdaniel, female    DOB: [birth year], 71 y.o.   MRN: [id number]  HPI  Patient presents to clinic today with complaint of urinary urgency, frequency and incontinence.  She reports this started 1 year ago but seem to worsen 2 weeks ago.  She reports that she stands up she will start to leak urine.  She can urinate before she leaves the house and then go to the grocery store that is 5 minutes down the road and can barely make it into the bathroom before she even starts grocery shopping.  She will urinate on herself if she coughs or sneezes.  She denies dysuria, blood in urine, bladder pressure or low back pain.  She has tried Kegel exercises with minimal results.  Review of Systems  No past medical history on file.  Current Outpatient Medications  Medication Sig Dispense Refill   acetaminophen (TYLENOL) 650 MG CR tablet Take 650 mg by mouth every 8 (eight) hours as needed for pain.     amLODipine (NORVASC) 10 MG tablet Take 1 tablet (10 mg total) by mouth daily. 90 tablet 2   atorvastatin (LIPITOR) 40 MG tablet Take 1 tablet (40 mg total) by mouth daily. 90 tablet 3   Blood Pressure Monitoring (BLOOD PRESSURE KIT) DEVI 1 Units by Does not apply route 2 (two) times daily. 1 each 0   cetirizine (ZYRTEC) 10 MG tablet Take 10 mg by mouth daily.     diclofenac Sodium (VOLTAREN) 1 % GEL Apply 2 g topically 4 (four) times daily. 50 g 1   losartan-hydrochlorothiazide (HYZAAR) 100-12.5 MG tablet TAKE 1 TABLET BY MOUTH DAILY 90 tablet 3   meloxicam (MOBIC) 15 MG tablet Take 1 tablet (15 mg total) by mouth daily as needed for pain. 30 tablet 0   No current facility-administered medications for this visit.    No Known Allergies  Family History  Problem Relation Age of Onset   Heart disease Mother    Stroke Mother    Diabetes Mother    Cancer Mother    Leukemia Mother    Breast cancer Sister     Social History   Socioeconomic History   Marital status: Single     Spouse name: Not on file   Number of children: Not on file   Years of education: Not on file   Highest education level: Not on file  Occupational History   Occupation: retired  Tobacco Use   Smoking status: Every Day    Packs/day: 0.75    Years: 35.00    Pack years: 26.25    Types: Cigarettes   Smokeless tobacco: Never  Vaping Use   Vaping Use: Never used  Substance and Sexual Activity   Alcohol use: Not Currently   Drug use: Never   Sexual activity: Yes    Partners: Male  Other Topics Concern   Not on file  Social History Narrative   Not on file   Social Determinants of Health   Financial Resource Strain: Not on file  Food Insecurity: Not on file  Transportation Needs: Not on file  Physical Activity: Not on file  Stress: Not on file  Social Connections: Not on file  Intimate Partner Violence: Not on file     Constitutional: Denies fever, malaise, fatigue, headache or abrupt weight changes.  Respiratory: Denies difficulty breathing, shortness of breath, cough or sputum production.   Cardiovascular: Denies chest pain, chest tightness, palpitations or swelling in the hands  or feet.  Gastrointestinal: Denies abdominal pain, bloating, constipation, diarrhea or blood in the stool.  GU: Patient reports urinary urgency, frequency and incontinence.  Denies pain with urination, burning sensation, blood in urine, odor or discharge.  No other specific complaints in a complete review of systems (except as listed in HPI above).     Objective:   Physical Exam   Wt Readings from Last 3 Encounters:  02/24/21 159 lb (72.1 kg)  02/13/21 161 lb (73 kg)  07/18/20 159 lb (72.1 kg)    General: Appears her stated age, obese, in NAD.  Cardiovascular: Normal rate and rhythm. S1,S2 noted.  No murmur, rubs or gallops noted. Pulmonary/Chest: Normal effort and positive vesicular breath sounds. No respiratory distress. No wheezes, rales or ronchi noted.  Abdomen: Soft and nontender.   Neurological: Alert and oriented.    BMET    Component Value Date/Time   NA 143 06/19/2020 0801   K 4.1 06/19/2020 0801   CL 110 06/19/2020 0801   CO2 23 06/19/2020 0801   GLUCOSE 98 06/19/2020 0801   BUN 19 06/19/2020 0801   CREATININE 0.87 06/19/2020 0801   CALCIUM 9.0 06/19/2020 0801   GFRNONAA 98 05/23/2019 1110   GFRAA 113 05/23/2019 1110    Lipid Panel     Component Value Date/Time   CHOL 168 06/19/2020 0801   TRIG 147 06/19/2020 0801   HDL 58 06/19/2020 0801   CHOLHDL 2.9 06/19/2020 0801   LDLCALC 86 06/19/2020 0801    CBC    Component Value Date/Time   WBC 8.5 06/19/2020 0801   RBC 3.90 06/19/2020 0801   HGB 11.9 06/19/2020 0801   HCT 36.8 06/19/2020 0801   PLT 324 06/19/2020 0801   MCV 94.4 06/19/2020 0801   MCH 30.5 06/19/2020 0801   MCHC 32.3 06/19/2020 0801   RDW 13.1 06/19/2020 0801   LYMPHSABS 2,133 05/23/2019 1110   EOSABS 52 05/23/2019 1110   BASOSABS 52 05/23/2019 1110    Hgb A1C Lab Results  Component Value Date   HGBA1C 6.1 (H) 06/19/2020           Assessment & Plan:    Webb Silversmith, NP This visit occurred during the SARS-CoV-2 public health emergency.  Safety protocols were in place, including screening questions prior to the visit, additional usage of staff PPE, and extensive cleaning of exam room while observing appropriate contact time as indicated for disinfecting solutions.

## 2021-03-10 NOTE — Patient Instructions (Signed)
Urinary Incontinence °Urinary incontinence refers to a condition in which a person is unable to control where and when to pass urine. A person with this condition will urinate involuntarily. This means that the person urinates when he or she does not mean to. °What are the causes? °This condition may be caused by: °Medicines. °Infections. °Constipation. °Overactive bladder muscles. °Weak bladder muscles. °Weak pelvic floor muscles. These muscles provide support for the bladder, intestine, and, in women, the uterus. °Enlarged prostate in men. The prostate is a gland near the bladder. When it gets too big, it can pinch the urethra. With the urethra blocked, the bladder can weaken and lose the ability to empty properly. °Surgery. °Emotional factors, such as anxiety, stress, or post-traumatic stress disorder (PTSD). °Spinal cord injury, nerve injury, or other neurological conditions. °Pelvic organ prolapse. This happens in women when organs move out of place and into the vagina. This movement can prevent the bladder and urethra from working properly. °What increases the risk? °The following factors may make you more likely to develop this condition: °Age. The older you are, the higher the risk. °Obesity. °Being physically inactive. °Pregnancy and childbirth. °Menopause. °Diseases that affect the nerves or spinal cord. °Long-term, or chronic, coughing. This can increase pressure on the bladder and pelvic floor muscles. °What are the signs or symptoms? °Symptoms may vary depending on the type of urinary incontinence you have. They include: °A sudden urge to urinate, and passing urine involuntarily before you can get to a bathroom (urge incontinence). °Suddenly passing urine when doing activities that force urine to pass, such as coughing, laughing, exercising, or sneezing (stress incontinence). °Needing to urinate often but urinating only a small amount, or constantly dribbling urine (overflow incontinence). °Urinating  because you cannot get to the bathroom in time due to a physical disability, such as arthritis or injury, or due to a communication or thinking problem, such as Alzheimer's disease (functional incontinence). °How is this diagnosed? °This condition may be diagnosed based on: °Your medical history. °A physical exam. °Tests, such as: °Urine tests. °X-rays of your kidney and bladder. °Ultrasound. °CT scan. °Cystoscopy. In this procedure, a health care provider inserts a tube with a light and camera (cystoscope) through the urethra and into the bladder to check for problems. °Urodynamic testing. These tests assess how well the bladder, urethra, and sphincter can store and release urine. There are different types of urodynamic tests, and they vary depending on what the test is measuring. °To help diagnose your condition, your health care provider may recommend that you keep a log of when you urinate and how much you urinate. °How is this treated? °Treatment for this condition depends on the type of incontinence that you have and its cause. Treatment may include: °Lifestyle changes, such as: °Quitting smoking. °Maintaining a healthy weight. °Staying active. Try to get 150 minutes of moderate-intensity exercise every week. Ask your health care provider which activities are safe for you. °Eating a healthy diet. °Avoid high-fat foods, like fried foods. °Avoid refined carbohydrates like white bread and white rice. °Limit how much alcohol and caffeine you drink. °Increase your fiber intake. Healthy sources of fiber include beans, whole grains, and fresh fruits and vegetables. °Behavioral changes, such as: °Pelvic floor muscle exercises. °Bladder training, such as lengthening the amount of time between bathroom breaks, or using the bathroom at regular intervals. °Using techniques to suppress bladder urges. This can include distraction techniques or controlled breathing exercises. °Medicines, such as: °Medicines to relax the  bladder   muscles and prevent bladder spasms. °Medicines to help slow or prevent the growth of a man's prostate. °Botox injections. These can help relax the bladder muscles. °Treatments, such as: °Using pulses of electricity to help change bladder reflexes (electrical nerve stimulation). °For women, using a medical device to prevent urine leaks. This is a small, tampon-like, disposable device that is inserted into the urethra. °Injecting collagen or carbon beads (bulking agents) into the urinary sphincter. These can help thicken tissue and close the bladder opening. °Surgery. °Follow these instructions at home: °Lifestyle °Limit alcohol and caffeine. These can fill your bladder quickly and irritate it. °Keep yourself clean to help prevent odors and skin damage. Ask your health care provider about special skin creams and cleansers that can protect the skin from urine. °Consider wearing pads or adult diapers. Make sure to change them regularly, and always change them right after experiencing incontinence. °General instructions °Take over-the-counter and prescription medicines only as told by your health care provider. °Use the bathroom about every 3-4 hours, even if you do not feel the need to urinate. Try to empty your bladder completely every time. After urinating, wait a minute. Then try to urinate again. °Make sure you are in a relaxed position while urinating. °If your incontinence is caused by nerve problems, keep a log of the medicines you take and the times you go to the bathroom. °Keep all follow-up visits. This is important. °Where to find more information °National Institute of Diabetes and Digestive and Kidney Diseases: www.niddk.nih.gov °American Urology Association: www.urologyhealth.org °Contact a health care provider if: °You have pain that gets worse. °Your incontinence gets worse. °Get help right away if: °You have a fever or chills. °You are unable to urinate. °You have redness in your groin area or  down your legs. °Summary °Urinary incontinence refers to a condition in which a person is unable to control where and when to pass urine. °This condition may be caused by medicines, infection, weak bladder muscles, weak pelvic floor muscles, enlargement of the prostate (in men), or surgery. °Factors such as older age, obesity, pregnancy and childbirth, menopause, neurological diseases, and chronic coughing may increase your risk for developing this condition. °Types of urinary incontinence include urge incontinence, stress incontinence, overflow incontinence, and functional incontinence. °This condition is usually treated first with lifestyle and behavioral changes, such as quitting smoking, eating a healthier diet, and doing regular pelvic floor exercises. Other treatment options include medicines, bulking agents, medical devices, electrical nerve stimulation, or surgery. °This information is not intended to replace advice given to you by your health care provider. Make sure you discuss any questions you have with your health care provider. °Document Revised: 08/25/2019 Document Reviewed: 08/25/2019 °Elsevier Patient Education © 2022 [employer] ° °

## 2021-03-10 NOTE — Assessment & Plan Note (Signed)
Urinalysis: Normal No indication to send for urine culture Encouraged timed voiding every 2 hours Cut back on carbonated and caffeinated beverages Continue Kegel exercises, discussed pelvic floor physical therapy but she declines at this time Rx for Ditropan 5 mg XL daily  Update me for new or worsening symptoms

## 2021-03-26 ENCOUNTER — Encounter: Payer: Self-pay | Admitting: Family Medicine

## 2021-03-26 ENCOUNTER — Other Ambulatory Visit: Payer: Self-pay

## 2021-03-26 ENCOUNTER — Ambulatory Visit (INDEPENDENT_AMBULATORY_CARE_PROVIDER_SITE_OTHER): Payer: Medicare Other | Admitting: Family Medicine

## 2021-03-26 ENCOUNTER — Telehealth: Payer: Self-pay

## 2021-03-26 VITALS — BP 102/72 | HR 59 | Ht 60.0 in | Wt 162.0 lb

## 2021-03-26 DIAGNOSIS — M1712 Unilateral primary osteoarthritis, left knee: Secondary | ICD-10-CM

## 2021-03-26 MED ORDER — MELOXICAM 7.5 MG PO TABS: 7.5000 mg | ORAL_TABLET | Freq: Every day | ORAL | 0 refills | Status: DC | PRN

## 2021-03-26 NOTE — Progress Notes (Signed)
°  ° °  Primary Care / Sports Medicine Office Visit  Patient Information:  Patient ID: Darlene Mcdaniel, female DOB: 1950/04/18 Age: 71 y.o. MRN: [id number]   [name] is a pleasant 71 y.o. female presenting with the following:  Chief Complaint  Patient presents with   Primary osteoarthritis of left knee    Injection 02/24/21 with relief; denies pain in office, but notes some stiffness    Vitals:   03/26/21 1010  BP: 102/72  Pulse: (!) 59  SpO2: 97%   Vitals:   03/26/21 1010  Weight: 162 lb (73.5 kg)  Height: 5' (1.524 m)   Body mass index is 31.64 kg/m.     Independent interpretation of notes and tests performed by another provider:   None  Procedures performed:   None  Pertinent History, Exam, Impression, and Recommendations:   Primary osteoarthritis of left knee Patient presents for follow-up to left knee pain in the setting of tricompartmental osteoarthritis, at her last visit on 02/24/2021 we did proceed with ultrasound-guided intra-articular corticosteroid injection.  We also discussed transition to as needed meloxicam 15 mg, hinged knee brace, and lastly we discussed a home exercise program.  Patient states that over interval visit she has noted excellent improvement in her symptomatology, that being said she is noting intermittent near buckling episodes, primarily with prolonged weightbearing.  Pain is overall well controlled but she has continued to dose meloxicam every other day as a precaution.  No new symptoms endorsed.  Given her stated symptomatology, I have advised further advance with formal physical therapy which she is amenable to, additionally with a goal to further limit long-term usage of meloxicam, I have reduced the dose to 7.5 mg which she can dose 1-2 tablets daily on an as-needed basis as an adjunct to physical therapy.  We will again send the authorization for viscosupplementation and coordinate follow-up accordingly.  Chronic condition,  symptomatic, Rx management   Orders & Medications Meds ordered this encounter  Medications   meloxicam (MOBIC) 7.5 MG tablet    Sig: Take 1-2 tablets (7.5-15 mg total) by mouth daily as needed for pain.    Dispense:  60 tablet    Refill:  0   Orders Placed This Encounter  Procedures   Ambulatory referral to Physical Therapy     No follow-ups on file.     Jerrol Banana, MD   Primary Care Sports Medicine [facility]

## 2021-03-26 NOTE — Telephone Encounter (Signed)
New case for viscosupplementation submitted through the [employer] portal.  Pending response.  For your information.   °

## 2021-03-26 NOTE — Assessment & Plan Note (Signed)
Patient presents for follow-up to left knee pain in the setting of tricompartmental osteoarthritis, at her last visit on 02/24/2021 we did proceed with ultrasound-guided intra-articular corticosteroid injection.  We also discussed transition to as needed meloxicam 15 mg, hinged knee brace, and lastly we discussed a home exercise program.  Patient states that over interval visit she has noted excellent improvement in her symptomatology, that being said she is noting intermittent near buckling episodes, primarily with prolonged weightbearing.  Pain is overall well controlled but she has continued to dose meloxicam every other day as a precaution.  No new symptoms endorsed.  Given her stated symptomatology, I have advised further advance with formal physical therapy which she is amenable to, additionally with a goal to further limit long-term usage of meloxicam, I have reduced the dose to 7.5 mg which she can dose 1-2 tablets daily on an as-needed basis as an adjunct to physical therapy.  We will again send the authorization for viscosupplementation and coordinate follow-up accordingly.  Chronic condition, symptomatic, Rx management

## 2021-03-28 ENCOUNTER — Ambulatory Visit: Payer: Medicare Other | Attending: Family Medicine

## 2021-03-28 ENCOUNTER — Other Ambulatory Visit: Payer: Self-pay

## 2021-03-28 DIAGNOSIS — R2689 Other abnormalities of gait and mobility: Secondary | ICD-10-CM | POA: Diagnosis not present

## 2021-03-28 DIAGNOSIS — M6281 Muscle weakness (generalized): Secondary | ICD-10-CM | POA: Insufficient documentation

## 2021-03-28 DIAGNOSIS — M1712 Unilateral primary osteoarthritis, left knee: Secondary | ICD-10-CM | POA: Insufficient documentation

## 2021-03-28 DIAGNOSIS — M25562 Pain in left knee: Secondary | ICD-10-CM | POA: Insufficient documentation

## 2021-03-28 DIAGNOSIS — G8929 Other chronic pain: Secondary | ICD-10-CM | POA: Diagnosis not present

## 2021-03-28 NOTE — Therapy (Addendum)
[facility] [address] [place], [place], [address] Phone: [phone]   Fax:  [phone]  Physical Therapy Evaluation  Patient Details  Name: Darlene Mcdaniel MRN: [id number] Date of Birth: [birth year] Referring Provider (PT): Dr. Zigmund Daniel   Encounter Date: 03/28/2021   PT End of Session - 03/28/21 1030     Visit Number 1    Number of Visits 13    Date for PT Re-Evaluation 05/09/21    Authorization Type eval: 03/28/21    PT Start Time 0925    PT Stop Time 1010    PT Time Calculation (min) 45 min    Activity Tolerance Patient tolerated treatment well    Behavior During Therapy [facility] for tasks assessed/performed             Past Medical History:  Diagnosis Date   Hyperlipidemia    Hypertension     History reviewed. No pertinent surgical history.  There were no vitals filed for this visit.    Subjective Assessment - 03/28/21 1029     Subjective L knee OA pain and buckling    Pertinent History Pt has history of L knee pain that started ~1 year ago and began gradually getting worse. ~6 weeks ago pain got so intense that the throbbing would wake her up from her sleep in the middle of the night. Went to go see her GP who referred her to Dr. Zigmund Daniel who gave her an injection in her knee on 02/24/21. Since the injection her knee pain has significantly decreased, but she has been experiencing some buckling in her knee. She reports no falls with the buckling. Pt reports still occasionally feeling the pain in her L knee, but no where near as bad as it had been before the injection. Prior to the injection the pt stated that she was an entirely different person due to the amount of pain she was in. Prior to the injection the pain at its worst would reach a 10/10 on the NPRS. Pt reports the pain was constant and throbbing. Pt stated that in the mornings her knee would be stiff and painful, but after she took a hot shower and started moving  it would ease up, then after some activity the pain would increase again. To ease the pain the pt mainly just tried to stay off of the leg. Pt stated that when she did have to walk she "walked like a rocking chair". Pt still can not walk for long durations, but she reports that it is more due to fatigue than pain. Pt has no previous back, hip, knee or ankle injuries that she can recall. Pt describes herself as sedentary and does no perform much physical activity. Pt enjoys reading and would like to be able to walk around [place] and the grocery store. Pt lives in a single level home with no stairs.    Limitations Walking;Lifting;Standing;House hold activities    How long can you stand comfortably? Not very long due to fatigue, but does not know exact time frame    How long can you walk comfortably? Not very long due to fatigue, but does not know exact time frame    Patient Stated Goals Walking longer distances to be able to go shopping, squatting down to do functional tasks like checking tire pressure    Currently in Pain? No/denies               [facility] PT Assessment - 03/28/21 1256  Assessment   Medical Diagnosis L knee OA    Referring Provider (PT) Dr. Zigmund Daniel    Onset Date/Surgical Date --   ~1 year ago   Hand Dominance Right    Next MD Visit Not reported    Prior Therapy No      Precautions   Precautions None      Restrictions   Weight Bearing Restrictions No           SUBJECTIVE Chief complaint:  L knee OA pain and buckling  History:  Pt has history of L knee pain that started ~1 year ago and began gradually getting worse. ~6 weeks ago pain got so intense that the throbbing would wake her up from her sleep in the middle of the night. Went to go see her GP who referred her to Dr. Zigmund Daniel who gave her an injection in her knee on 02/24/21. Since the injection her knee pain has significantly decreased, but she has been experiencing some buckling in her knee. She reports no  falls with the buckling. Pt reports still occasionally feeling the pain in her L knee, but no where near as bad as it had been before the injection. Prior to the injection the pt stated that she was an entirely different person due to the amount of pain she was in. Prior to the injection the pain at its worst would reach a 10/10 on the NPRS. Pt reports the pain was constant and throbbing. Pt stated that in the mornings her knee would be stiff and painful, but after she took a hot shower and started moving it would ease up, then after some activity the pain would increase again. To ease the pain the pt mainly just tried to stay off of the leg. Pt stated that when she did have to walk she "walked like a rocking chair". Pt still can not walk for long durations, but she reports that it is more due to fatigue than pain. Pt has no previous back, hip, knee or ankle injuries that she can recall. Pt describes herself as sedentary and does no perform much physical activity. Pt enjoys reading and would like to be able to walk around [place] and the grocery store. Pt lives in a single level home with no stairs.  Red flags (bowel/bladder changes, saddle paresthesia, personal history of cancer, chills/fever, night sweats, unrelenting pain, unexplained weight gain/loss): Negative   Referring Dx: L knee OA Referring Provider: Dr. Zigmund Daniel Pain Location: L knee Pain quality: throbbing Pain Intensity: Present: 0/10, Best: 0/10, Pre-injection Worst: 10/10  24 hour pain behavior: (Prior to injection) Stiffness and pain were worse in the morning. Then after some movement and a hot shower improved a little, but increased as activity levels went on. Aggravating factors: Movement Easing factors: Rest  Radiating symptoms: No  Numbness/Tingling: No Popping, clicking, locking, or swelling at time or injury or No currently: Yes Prior history of knee injury, surgery, and/or pain:  No Prior history of back, hip, or ankle injury,  surgery, and/or pain: No Falls in the last 6 months: No  Dominant hand: right Imaging: Yes  How long can you sit: Unlimited How long can you stand: Not very long due to fatigue, but does not know exact time frame How long can you walk: Not very long due to fatigue, but does not know exact time frame Occupational demands: Retired Hobbies: Reading Typical footwear: Sneakers Goals for therapy: Be able to walk for longer distances for grocery store and shopping,  squat for functional activities (squatted down to check her tire pressure and was unable to stand back up)    OBJECTIVE  MUSCULOSKELETAL: Tremor: Absent Bulk: Normal Tone: Normal, no spasticity, rigidity, or clonus appreciated Trophic Changes: No ecchymosis, erythema, or edema noted around knee. No gross knee deformity noted  Posture No gross abnormalities noted in seated posture Standing posture: slight bowing of L leg (Knee varum), weight shifts on to R side > L side.  Hip AROM: WFL and painless with overpressure in all planes  Gait L sided Antalgic gait with decreased stance time and L sided truncal shift during LLE stance time indicative of lateral hip weakness  Palpation Quadriceps: No tightness or pain Quadriceps tendon: No pain Joint line: No pain Patella: No pain      Strength R/L 4+/4* Hip flexion 4/5 Hip external rotation 4/4* Hip internal rotation 4+/3+ Hip extension  4+/4+ Hip abduction 4+/5 Hip adduction 5/5 Knee extension 5/5 Knee flexion 5/5 Ankle Dorsiflexion *indicates pain  AROM Knee R/L Flexion: 132/123  Extension: 2/5 *indicates pain   PROM Knee R/L Flexion: L:134   Muscle Length Quad length Pat Patrick): Subjective reports of tightness, however length of tissue is equal and WNL bilaterally with absence of hip flexion on table.   Passive Accessory Motion Patella superior and medial: WNL bilaterally   NEUROLOGICAL:  Mental Status Patient's fund of knowledge is within  normal limits for educational level.  Sensation Grossly intact to light touch bilateral LEs as determined by testing dermatomes L2-S2 Proprioception and hot/cold testing deferred on this date    FUNCTIONAL TESTS  5TSTS: 15.97 10MWT: 0.73s Single leg balance: R:~3-5s depending on three trials performed. L: <2s Squat: Weight shifted to the R and needed UE support in // bars. Knee valgum on RLE appreciated with knee varum on LLE and limited hip hinge leading to B knees to translate anteriorly causing concordant pain on L knee. Symptom and comfort improved with widening BOS.  HEP below provided. Access Code: [email or web address] URL: [email or web address] Date: 03/28/2021 Prepared by: Larna Daughters Exercises Long Sitting Quad Set with Towel Roll Under Heel - 1 x daily - 7 x weekly - 2 sets - 15 reps - 3 hold Supine Bridge with Gluteal Set and Spinal Articulation - 1 x daily - 7 x weekly - 2 sets - 10 reps Sidelying Hip Abduction - 1 x daily - 7 x weekly - 2 sets - 10 reps    Objective measurements completed on examination: See above findings.    PT Education - 03/28/21 1316     Education Details POC, HEP    Person(s) Educated Patient    Methods Explanation;Demonstration;Handout;Verbal cues;Tactile cues    Comprehension Verbalized understanding;Returned demonstration              PT Short Term Goals - 03/28/21 1204       PT SHORT TERM GOAL #1   Title Pt will be independent with HEP in order to decrease L knee pain and increase strength in order to improve pain-free function at home.    Time 3    Period Weeks    Status New    Target Date 04/18/21               PT Long Term Goals - 03/28/21 1247       PT LONG TERM GOAL #1   Title Pt will decrease 5TSTS time by at least 3s in order to demonstrate clinically significant improvement in LE strength    Baseline  03/28/21: 15.97s    Time 6    Period Weeks    Status New    Target Date 05/09/21      PT LONG TERM GOAL  #2   Title Pt will increase 10MWT by at least 0.13 m/s in order to demonstrate clinically significant improvement in household and community ambulation to decrease risk for falls.    Baseline 03/28/21: 0.52m/s    Time 6    Period Weeks    Status New    Target Date 05/09/21      PT LONG TERM GOAL #3   Title Pt will increase L knee extension to 0 deg. in order to improve functional ability in walking and standing    Baseline 03/28/21: Lacking 5 deg form TKE    Time 6    Period Weeks    Status New    Target Date 05/09/21      PT LONG TERM GOAL #4   Title Pt will increase FOTO to at least 64 (target score) in order to demonstrate signifcant improvement in function related to L knee pain and buckling.    Baseline 03/28/21: 50    Time 6    Period Weeks    Status New    Target Date 05/09/21                    Plan - 03/28/21 1239     Clinical Impression Statement Pt is a pleasant 71 y/o female referred for L knee OA and pain. PT examination reveals deficits in L knee extension ROM, antalgic and abnormal gait mechanics, decreased SLS on L > R due to poor hip stability and joint compression pain,L>R hip extension strength, and pain with standing and community distance walking. These deficits are making pt modify activities relying on mobile scooters for community errand completion, inability to squat to perfrom tasks and ADL's down low. Pt would benefit from PT services to address deficits in order to return to full pain free function at home.    Personal Factors and Comorbidities Fitness;Comorbidity 3+;Past/Current Experience;Time since onset of injury/illness/exacerbation    Comorbidities Knee OA    Examination-Activity Limitations Stand;Locomotion Level;Squat    Examination-Participation Restrictions Shop;Community Activity;Yard Work    Stability/Clinical Decision Making Stable/Uncomplicated    Clinical Decision Making Low    Rehab Potential Good    PT Frequency 2x / week    PT  Duration 6 weeks    PT Treatment/Interventions Electrical Stimulation;Cryotherapy;Iontophoresis 4mg /ml Dexamethasone;Ultrasound;Gait training;Functional mobility training;Therapeutic activities;Therapeutic exercise;Balance training;Manual techniques;Passive range of motion;Dry needling;Vestibular;Spinal Manipulations;Joint Manipulations;ADLs/Self Care Home Management;Aquatic Therapy;Moist Heat;Stair training;Neuromuscular re-education;Patient/family education    PT Next Visit Plan Reassess HEP, Functional strengthening, stretching hamstrings and hip flexors    PT Home Exercise Plan [id number]    Consulted and Agree with Plan of Care Patient             Patient will benefit from skilled therapeutic intervention in order to improve the following deficits and impairments:  Abnormal gait, Decreased range of motion, Decreased strength, Pain, Decreased balance, Decreased endurance, Decreased mobility, Decreased activity tolerance, Difficulty walking  Visit Diagnosis: Chronic pain of left knee  Muscle weakness (generalized)  Other abnormalities of gait and mobility     Problem List Patient Active Problem List   Diagnosis Date Noted   Mixed incontinence 03/10/2021   Class 1 obesity due to excess calories with body mass index (BMI) of 31.0 to 31.9 in adult 02/13/2021   Primary osteoarthritis of left knee 06/18/2020  Obstructive sleep apnea 07/18/2019   Hyperlipidemia 06/21/2019   Hypertension 05/23/2019   Shadrack Brummitt SPT  [place]em Caster. Fairly IV, PT, DPT Physical Therapist- [facility]  03/28/2021, 1:28 PM  [place] [address] [place], [place], [address] Phone: [phone]   Fax:  [phone]  Name: Darlene Mcdaniel MRN: [id number] Date of Birth: 1950/12/19

## 2021-04-02 ENCOUNTER — Other Ambulatory Visit: Payer: Self-pay

## 2021-04-02 ENCOUNTER — Ambulatory Visit: Payer: Medicare Other | Attending: Family Medicine

## 2021-04-02 DIAGNOSIS — M25562 Pain in left knee: Secondary | ICD-10-CM | POA: Insufficient documentation

## 2021-04-02 DIAGNOSIS — G8929 Other chronic pain: Secondary | ICD-10-CM | POA: Diagnosis not present

## 2021-04-02 DIAGNOSIS — R2689 Other abnormalities of gait and mobility: Secondary | ICD-10-CM | POA: Insufficient documentation

## 2021-04-02 DIAGNOSIS — M6281 Muscle weakness (generalized): Secondary | ICD-10-CM | POA: Diagnosis not present

## 2021-04-02 NOTE — Therapy (Addendum)
[place] [facility] [address] [place], [place], [address] Phone: [phone]   Fax:  [phone]  Physical Therapy Treatment  Patient Details  Name: Darlene Mcdaniel MRN: [id number] Date of Birth: 03/22/1950 Referring Provider (PT): Dr. Ashley Royalty   Encounter Date: 04/02/2021   PT End of Session - 04/02/21 1327     Visit Number 2    Number of Visits 13    Date for PT Re-Evaluation 05/09/21    Authorization Type eval: 03/28/21    PT Start Time 1317    PT Stop Time 1357    PT Time Calculation (min) 40 min    Activity Tolerance Patient tolerated treatment well    Behavior During Therapy [facility] for tasks assessed/performed             Past Medical History:  Diagnosis Date   Hyperlipidemia    Hypertension     History reviewed. No pertinent surgical history.  There were no vitals filed for this visit.   Subjective Assessment - 04/02/21 1319     Subjective Pt reports no pain upon arrival, but knee has been feeling stiff. Pt states she has not been performing her HEP. Pt reports having a few more episodes of her knee buckling under her, but no falls.    Pertinent History Pt has history of L knee pain that started ~1 year ago and began gradually getting worse. ~6 weeks ago pain got so intense that the throbbing would wake her up from her sleep in the middle of the night. Went to go see her GP who referred her to Dr. Ashley Royalty who gave her an injection in her knee on 02/24/21. Since the injection her knee pain has significantly decreased, but she has been experiencing some buckling in her knee. She reports no falls with the buckling. Pt reports still occasionally feeling the pain in her L knee, but no where near as bad as it had been before the injection. Prior to the injection the pt stated that she was an entirely different person due to the amount of pain she was in. Prior to the injection the pain at its worst would reach a 10/10 on the NPRS. Pt  reports the pain was constant and throbbing. Pt stated that in the mornings her knee would be stiff and painful, but after she took a hot shower and started moving it would ease up, then after some activity the pain would increase again. To ease the pain the pt mainly just tried to stay off of the leg. Pt stated that when she did have to walk she "walked like a rocking chair". Pt still can not walk for long durations, but she reports that it is more due to fatigue than pain. Pt has no previous back, hip, knee or ankle injuries that she can recall. Pt describes herself as sedentary and does no perform much physical activity. Pt enjoys reading and would like to be able to walk around [place] and the grocery store. Pt lives in a single level home with no stairs.    Limitations Walking;Lifting;Standing;House hold activities    How long can you stand comfortably? Not very long due to fatigue, but does not know exact time frame    How long can you walk comfortably? Not very long due to fatigue, but does not know exact time frame    Patient Stated Goals Walking longer distances to be able to go shopping, squatting down to do functional tasks like checking  tire pressure               There Ex: Nu-step while getting interim history L1 x 5 min Quad set 2 x 10 with 3s hold Passive SLR stretch 3 x 30s SLR x 10 Bridge 2 x 8 R sidelying L hip abduction 2 x 8 Sit to stand from regular height chair no UE support x 8 Standing L hip extension 2 x 8 L hip flexor stretch on 6" step at stairs 2 x 30s hold Seated hamstring stretch 4 x 30s hold  Reviewed HEP and educated pt on importance of HEP, dosage, proper positioning and body mechanics.   Focused session on gentle stretching and light strengthening. Pt had difficulty with SLR and was unable to complete the last 2 reps fully. Pt needed moderate cueing for bridges in order to properly engage her glutes and avoid strain on her lower back. Pt needed cueing in  order to control her descent with sit to stands and avoid uncontrolled descent down in the chair. During hip flexor stretch on 6" step at the stairs pt was unable to feel the stretch in her hip flexor and instead stated that she felt like she was putting more weight through her arms on the railings. Pt would benefit from an alternative stretch for her L hip flexor and quad. Pt would benefit from continued PT services in order to address deficits in ROM, gait, and LE strength in order to improve daily function and QoL.                        PT Short Term Goals - 03/28/21 1204       PT SHORT TERM GOAL #1   Title Pt will be independent with HEP in order to decrease L knee pain and increase strength in order to improve pain-free function at home.    Time 3    Period Weeks    Status New    Target Date 04/18/21               PT Long Term Goals - 03/28/21 1247       PT LONG TERM GOAL #1   Title Pt will decrease 5TSTS time by at least 3s in order to demonstrate clinically significant improvement in LE strength    Baseline 03/28/21: 15.97s    Time 6    Period Weeks    Status New    Target Date 05/09/21      PT LONG TERM GOAL #2   Title Pt will increase by at least 0.13 m/s in order to demonstrate clinically significant improvement in household and community ambulation to decrease risk for falls.    Baseline 03/28/21: 0.56m/s    Time 6    Period Weeks    Status New    Target Date 05/09/21      PT LONG TERM GOAL #3   Title Pt will increase L knee extension to 0 deg. in order to improve functional ability in walking and standing    Baseline 03/28/21: Lacking 5 deg form TKE    Time 6    Period Weeks    Status New    Target Date 05/09/21      PT LONG TERM GOAL #4   Title Pt will increase FOTO to at least 64 (target score) in order to demonstrate signifcant improvement in function related to L knee pain and buckling.    Baseline 03/28/21: 50  Time 6     Period Weeks    Status New    Target Date 05/09/21                   Plan - 04/02/21 1415     Clinical Impression Statement Focused session on gentle stretching and light strengthening. Pt had difficulty with SLR and was unable to complete the last 2 reps fully. Pt needed moderate cueing for bridges in order to properly engage her glutes and avoid strain on her lower back. Pt needed cueing in order to control her descent with sit to stands and avoid uncontrolled descent down in the chair. During hip flexor stretch on 6" step at the stairs pt was unable to feel the stretch in her hip flexor and instead stated that she felt like she was putting more weight through her arms on the railings. Pt would benefit from an alternative stretch for her L hip flexor and quad. Pt would benefit from continued PT services in order to address deficits in ROM, gait, and LE strength in order to improve daily function and QoL.    Personal Factors and Comorbidities Fitness;Comorbidity 3+;Past/Current Experience;Time since onset of injury/illness/exacerbation    Comorbidities Knee OA    Examination-Activity Limitations Stand;Locomotion Level;Squat    Examination-Participation Restrictions Shop;Community Activity;Yard Work    Stability/Clinical Decision Making Stable/Uncomplicated    Rehab Potential Good    PT Frequency 2x / week    PT Duration 6 weeks    PT Treatment/Interventions Electrical Stimulation;Cryotherapy;Iontophoresis 4mg /ml Dexamethasone;Ultrasound;Gait training;Functional mobility training;Therapeutic activities;Therapeutic exercise;Balance training;Manual techniques;Passive range of motion;Dry needling;Vestibular;Spinal Manipulations;Joint Manipulations;ADLs/Self Care Home Management;Aquatic Therapy;Moist Heat;Stair training;Neuromuscular re-education;Patient/family education    PT Next Visit Plan Reassess HEP, Functional strengthening, stretching hamstrings and hip flexors    PT Home Exercise  Plan    Consulted and Agree with Plan of Care Patient             Patient will benefit from skilled therapeutic intervention in order to improve the following deficits and impairments:  Abnormal gait, Decreased range of motion, Decreased strength, Pain, Decreased balance, Decreased endurance, Decreased mobility, Decreased activity tolerance, Difficulty walking  Visit Diagnosis: Chronic pain of left knee  Muscle weakness (generalized)     Problem List Patient Active Problem List   Diagnosis Date Noted   Mixed incontinence 03/10/2021   Class 1 obesity due to excess calories with body mass index (BMI) of 31.0 to 31.9 in adult 02/13/2021   Primary osteoarthritis of left knee 06/18/2020   Obstructive sleep apnea 07/18/2019   Hyperlipidemia 06/21/2019   Hypertension 05/23/2019   Salihah Peckham SPT 05/25/2019 PT, DPT, GCS  Huprich,Jason, PT 04/03/2021, 9:15 AM  [facility] [address] [place], [place], [place] Phone: [phone]   Fax:  [phone]  Name: Darlene Mcdaniel MRN: [name] Date of Birth: 10/10/1950

## 2021-04-03 ENCOUNTER — Ambulatory Visit (INDEPENDENT_AMBULATORY_CARE_PROVIDER_SITE_OTHER): Payer: Medicare Other

## 2021-04-03 ENCOUNTER — Other Ambulatory Visit: Payer: Self-pay

## 2021-04-03 DIAGNOSIS — Z0000 Encounter for general adult medical examination without abnormal findings: Secondary | ICD-10-CM

## 2021-04-03 NOTE — Progress Notes (Addendum)
Virtual Visit via Telephone Note  I connected with Darlene Mcdaniel on 04/03/21 at  9:00 AM EST by telephone and verified that I am speaking with the correct person using two identifiers. I discussed the limitations of evaluation and management by telemedicine and the availability of in person appointments. The patient expressed understanding and agreed to proceed.  Location: Patient: Darlene Mcdaniel Provider: Webb Silversmith, NP   I provided 40 minutes of non-face-to-face time during this encounter.   Wilson Singer, CMA   HPI:  Patient presents to clinic today for their subsequent annual Medicare wellness exam.  Past Medical History:  Diagnosis Date   Hyperlipidemia    Hypertension     Current Outpatient Medications  Medication Sig Dispense Refill   amLODipine (NORVASC) 10 MG tablet Take 1 tablet (10 mg total) by mouth daily. 90 tablet 2   atorvastatin (LIPITOR) 40 MG tablet Take 1 tablet (40 mg total) by mouth daily. 90 tablet 3   Blood Pressure Monitoring (BLOOD PRESSURE KIT) DEVI 1 Units by Does not apply route 2 (two) times daily. 1 each 0   cetirizine (ZYRTEC) 10 MG tablet Take 10 mg by mouth every other day.     diclofenac Sodium (VOLTAREN) 1 % GEL Apply 2 g topically 4 (four) times daily. 50 g 1   losartan-hydrochlorothiazide (HYZAAR) 100-12.5 MG tablet TAKE 1 TABLET BY MOUTH DAILY 90 tablet 3   meloxicam (MOBIC) 7.5 MG tablet Take 1-2 tablets (7.5-15 mg total) by mouth daily as needed for pain. 60 tablet 0   oxybutynin (DITROPAN XL) 5 MG 24 hr tablet Take 1 tablet (5 mg total) by mouth at bedtime. 30 tablet 2   acetaminophen (TYLENOL) 650 MG CR tablet Take 650 mg by mouth every 8 (eight) hours as needed for pain. (Patient not taking: Reported on 04/03/2021)     No current facility-administered medications for this visit.    No Known Allergies  Family History  Problem Relation Age of Onset   Heart disease Mother    Stroke Mother    Diabetes Mother    Cancer Mother     Leukemia Mother    Breast cancer Sister     Social History   Socioeconomic History   Marital status: Single    Spouse name: Not on file   Number of children: 2   Years of education: Not on file   Highest education level: Not on file  Occupational History   Occupation: retired   Occupation: Retired  Tobacco Use   Smoking status: Every Day    Packs/day: 0.50    Years: 35.00    Pack years: 17.50    Types: Cigarettes   Smokeless tobacco: Never  Vaping Use   Vaping Use: Never used  Substance and Sexual Activity   Alcohol use: Not Currently   Drug use: Never   Sexual activity: Yes    Partners: Male  Other Topics Concern   Not on file  Social History Narrative   Not on file   Social Determinants of Health   Financial Resource Strain: Low Risk    Difficulty of Paying Living Expenses: Not hard at all  Food Insecurity: No Food Insecurity   Worried About Charity fundraiser in the Last Year: Never true   [place] in the Last Year: Never true  Transportation Needs: No Transportation Needs   Lack of Transportation (Medical): No   Lack of Transportation (Non-Medical): No  Physical Activity: Sufficiently Active   Days  of Exercise per Week: 5 days   Minutes of Exercise per Session: 60 min  Stress: No Stress Concern Present   Feeling of Stress : Not at all  Social Connections: Moderately Integrated   Frequency of Communication with Friends and Family: More than three times a week   Frequency of Social Gatherings with Friends and Family: More than three times a week   Attends Religious Services: More than 4 times per year   Active Member of [employer] or Organizations: Yes   Attends Music therapist: More than 4 times per year   Marital Status: Separated  Intimate Partner Violence: Not At Risk   Fear of Current or Ex-Partner: No   Emotionally Abused: No   Physically Abused: No   Sexually Abused: No    Hospitiliaztions: No hospitalization in the past 12  mths.  Health Maintenance: Dexa Scan: ordered 06/18/20 Mammogram: 12/31/20 Cologuard: 06/20/19 Lung Screening : 07/18/20 Dental: 4 times year Opthalmology: Annual, Dr. Wyatt Portela   Influenza: 11/29/19 Moderna: 12/04/19, 04/18/19, 03/21/19 Prevnar: 06/07/19 Pneumococcal: 06/18/20  :   Providers:  PCP: Webb Silversmith, NP Sports Medicine: Joellyn Rued, MD Physical Therapist: Lynelle Smoke, [facility]  I have personally reviewed and have noted:  1. The patient's medical and social history 2. Their use of alcohol, tobacco or illicit drugs 3. Their current medications and supplements 4. The patient's functional ability including ADL's, fall risks, home safety risks and hearing or visual impairment. 5. Diet and physical activities 6. Evidence for depression or mood disorder  Subjective:   Review of Systems:   Constitutional: Denies fever, malaise, fatigue, headache or abrupt weight changes.  HEENT: Denies eye pain, eye redness, ear pain, ringing in the ears, wax buildup, runny nose, nasal congestion, bloody nose, or sore throat. Respiratory: Denies difficulty breathing, shortness of breath, cough or sputum production.   Cardiovascular: Denies chest pain, chest tightness, palpitations or swelling in the hands or feet.  Gastrointestinal: Intermittent heartburn, Denies abdominal pain, bloating, constipation, diarrhea or blood in the stool.  GU: Denies urgency, frequency, pain with urination, burning sensation, blood in urine, odor or discharge. Musculoskeletal: Denies decrease in range of motion, difficulty with gait, muscle pain or joint pain and swelling.  Skin: Denies redness, rashes, lesions or ulcercations.  Neurological: Denies dizziness, difficulty with memory, difficulty with speech or problems with balance and coordination.  Psych: Denies anxiety, depression, SI/HI.  No other specific complaints in a complete review of systems (except as listed in HPI above).  Objective:  PE:   There  were no vitals taken for this visit. Wt Readings from Last 3 Encounters:  03/26/21 162 lb (73.5 kg)  03/10/21 159 lb (72.1 kg)  02/24/21 159 lb (72.1 kg)     BMET    Component Value Date/Time   NA 143 06/19/2020 0801   K 4.1 06/19/2020 0801   CL 110 06/19/2020 0801   CO2 23 06/19/2020 0801   GLUCOSE 98 06/19/2020 0801   BUN 19 06/19/2020 0801   CREATININE 0.87 06/19/2020 0801   CALCIUM 9.0 06/19/2020 0801   GFRNONAA 98 05/23/2019 1110   GFRAA 113 05/23/2019 1110    Lipid Panel     Component Value Date/Time   CHOL 168 06/19/2020 0801   TRIG 147 06/19/2020 0801   HDL 58 06/19/2020 0801   CHOLHDL 2.9 06/19/2020 0801   LDLCALC 86 06/19/2020 0801    CBC    Component Value Date/Time   WBC 8.5 06/19/2020 0801   RBC 3.90 06/19/2020  0801   HGB 11.9 06/19/2020 0801   HCT 36.8 06/19/2020 0801   PLT 324 06/19/2020 0801   MCV 94.4 06/19/2020 0801   MCH 30.5 06/19/2020 0801   MCHC 32.3 06/19/2020 0801   RDW 13.1 06/19/2020 0801   LYMPHSABS 2,133 05/23/2019 1110   EOSABS 52 05/23/2019 1110   BASOSABS 52 05/23/2019 1110    Hgb A1C Lab Results  Component Value Date   HGBA1C 6.1 (H) 06/19/2020      Assessment and Plan:   Medicare Annual Wellness Visit:  Diet: Heart healthy  Physical activity: mild to moderate exercise  Depression/mood screen: Negative,  Flowsheet Row Clinical Support from 04/03/2021 in [facility]  PHQ-9 Total Score 0      Hearing: Intact to whispered voice Visual acuity: Grossly normal, performs annual eye exam  ADLs: Capable Fall risk: None Home safety: Good  Cognitive evaluation:  6CIT Screen 04/03/2021 08/01/2019  What Year? 0 points 0 points  What month? 0 points 0 points  What time? 0 points 0 points  Count back from 20 0 points 0 points  Months in reverse 0 points 0 points  Repeat phrase 0 points 0 points  Total Score 0 0     EOL planning: No Adv directives, full code Nurse's Notes: The pt will call and make an  appt with [facility] for her Shingrix vaccine.   Next appointment: No upcoming appts scheduled, F/U 1 year for Annual Medicare Wellness Visit.    Wilson Singer, CMA

## 2021-04-07 ENCOUNTER — Ambulatory Visit: Payer: Medicare Other

## 2021-04-07 ENCOUNTER — Other Ambulatory Visit: Payer: Self-pay

## 2021-04-07 DIAGNOSIS — M25562 Pain in left knee: Secondary | ICD-10-CM | POA: Diagnosis not present

## 2021-04-07 DIAGNOSIS — G8929 Other chronic pain: Secondary | ICD-10-CM | POA: Diagnosis not present

## 2021-04-07 DIAGNOSIS — M6281 Muscle weakness (generalized): Secondary | ICD-10-CM

## 2021-04-07 DIAGNOSIS — R2689 Other abnormalities of gait and mobility: Secondary | ICD-10-CM | POA: Diagnosis not present

## 2021-04-07 NOTE — Therapy (Signed)
[facility] [address] [place], [place], [address] Phone: [phone]   Fax:  [phone]  Physical Therapy Treatment  Patient Details  Name: Darlene Mcdaniel MRN: [id number] Date of Birth: 07/28/1950 Referring Provider (PT): Dr. Ashley Royalty   Encounter Date: 04/07/2021   PT End of Session - 04/07/21 1142     Visit Number 3    Number of Visits 13    Date for PT Re-Evaluation 05/09/21    Authorization Type eval: 03/28/21    PT Start Time 1145    PT Stop Time 1230    PT Time Calculation (min) 45 min    Activity Tolerance Patient tolerated treatment well    Behavior During Therapy [facility] for tasks assessed/performed              Past Medical History:  Diagnosis Date   Hyperlipidemia    Hypertension     History reviewed. No pertinent surgical history.  There were no vitals filed for this visit.   Subjective Assessment - 04/07/21 1141     Subjective Pt reports no pain upon arrival, but knee has been feeling stiff this morning. Overall significant improvement in pain since the injection. Pt still notices infrequent buckling in LLE but no falls. No specific questions or concerns upon arrival.    Pertinent History Pt has history of L knee pain that started ~1 year ago and began gradually getting worse. ~6 weeks ago pain got so intense that the throbbing would wake her up from her sleep in the middle of the night. Went to go see her GP who referred her to Dr. Ashley Royalty who gave her an injection in her knee on 02/24/21. Since the injection her knee pain has significantly decreased, but she has been experiencing some buckling in her knee. She reports no falls with the buckling. Pt reports still occasionally feeling the pain in her L knee, but no where near as bad as it had been before the injection. Prior to the injection the pt stated that she was an entirely different person due to the amount of pain she was in. Prior to the injection the  pain at its worst would reach a 10/10 on the NPRS. Pt reports the pain was constant and throbbing. Pt stated that in the mornings her knee would be stiff and painful, but after she took a hot shower and started moving it would ease up, then after some activity the pain would increase again. To ease the pain the pt mainly just tried to stay off of the leg. Pt stated that when she did have to walk she "walked like a rocking chair". Pt still can not walk for long durations, but she reports that it is more due to fatigue than pain. Pt has no previous back, hip, knee or ankle injuries that she can recall. Pt describes herself as sedentary and does no perform much physical activity. Pt enjoys reading and would like to be able to walk around [place] and the grocery store. Pt lives in a single level home with no stairs.    Limitations Walking;Lifting;Standing;House hold activities    How long can you stand comfortably? Not very long due to fatigue, but does not know exact time frame    How long can you walk comfortably? Not very long due to fatigue, but does not know exact time frame    Patient Stated Goals Walking longer distances to be able to go shopping, squatting down to do functional  tasks like checking tire pressure    Currently in Pain? No/denies             TREATMENT   There Ex: NuStep (seat 5, arms 8) while getting interim history L1-2 x 5 min for warm-up (4 minutes unbilled); L quad set 2 x 10 with 3s hold L hamstring stretch 30s x 2; L SLR with 2# ankle weight (AW) 2 x 10; Hooklying clams with manual resistance 2 x 10; Hooklying adductor squeeze with manual resistance 2 x 10; Supine L SAQ with manual resistance from therapist 2 x 10; Hooklying bridges 2 x 10, cues for glute squeeze to avoid lumbar extension; R sidelying L hip abduction with 2# AW 2 x 10; R sidelying L reverse clams with 2# AW 2 x 10; Supine L hip flexor stretch x 30s; Total Gym (TG) level 22 (L22) partial squats with  red tband around knees to prevent valgus 2 x 10; TG L22 heel raises x 10;   Pt educated throughout session about proper posture and technique with exercises. Improved exercise technique, movement at target joints, use of target muscles after min to mod verbal, visual, tactile cues.    Focused session on gentle stretching and light strengthening. Progressed mat table strengthening by adding a 2# ankle weight and limited WB strengthening to minimize L knee pain. She denies any pain in low back during bridges today and is able to perform Total Gym partial squats without pain. Will continue to progress pain-free strengthening in future sessions. Pt would benefit from continued PT services in order to address deficits in ROM, gait, and LE strength in order to improve daily function and quality of life.                        PT Short Term Goals - 03/28/21 1204       PT SHORT TERM GOAL #1   Title Pt will be independent with HEP in order to decrease L knee pain and increase strength in order to improve pain-free function at home.    Time 3    Period Weeks    Status New    Target Date 04/18/21               PT Long Term Goals - 03/28/21 1247       PT LONG TERM GOAL #1   Title Pt will decrease 5TSTS time by at least 3s in order to demonstrate clinically significant improvement in LE strength    Baseline 03/28/21: 15.97s    Time 6    Period Weeks    Status New    Target Date 05/09/21      PT LONG TERM GOAL #2   Title Pt will increase by at least 0.13 m/s in order to demonstrate clinically significant improvement in household and community ambulation to decrease risk for falls.    Baseline 03/28/21: 0.42m/s    Time 6    Period Weeks    Status New    Target Date 05/09/21      PT LONG TERM GOAL #3   Title Pt will increase L knee extension to 0 deg. in order to improve functional ability in walking and standing    Baseline 03/28/21: Lacking 5 deg form TKE     Time 6    Period Weeks    Status New    Target Date 05/09/21      PT LONG TERM GOAL #4  Title Pt will increase FOTO to at least 64 (target score) in order to demonstrate signifcant improvement in function related to L knee pain and buckling.    Baseline 03/28/21: 50    Time 6    Period Weeks    Status New    Target Date 05/09/21                   Plan - 04/07/21 1142     Clinical Impression Statement Focused session on gentle stretching and light strengthening. Progressed mat table strengthening by adding a 2# ankle weight and limited WB strengthening to minimize L knee pain. She denies any pain in low back during bridges today and is able to perform Total Gym partial squats without pain. Will continue to progress pain-free strengthening in future sessions. Pt would benefit from continued PT services in order to address deficits in ROM, gait, and LE strength in order to improve daily function and quality of life.    Personal Factors and Comorbidities Fitness;Comorbidity 3+;Past/Current Experience;Time since onset of injury/illness/exacerbation    Comorbidities Knee OA    Examination-Activity Limitations Stand;Locomotion Level;Squat    Examination-Participation Restrictions Shop;Community Activity;Yard Work    Stability/Clinical Decision Making Stable/Uncomplicated    Rehab Potential Good    PT Frequency 2x / week    PT Duration 6 weeks    PT Treatment/Interventions Electrical Stimulation;Cryotherapy;Iontophoresis 4mg /ml Dexamethasone;Ultrasound;Gait training;Functional mobility training;Therapeutic activities;Therapeutic exercise;Balance training;Manual techniques;Passive range of motion;Dry needling;Vestibular;Spinal Manipulations;Joint Manipulations;ADLs/Self Care Home Management;Aquatic Therapy;Moist Heat;Stair training;Neuromuscular re-education;Patient/family education    PT Next Visit Plan Reassess HEP, Functional strengthening, stretching hamstrings and hip flexors    PT  Home Exercise Plan    Consulted and Agree with Plan of Care Patient              Patient will benefit from skilled therapeutic intervention in order to improve the following deficits and impairments:  Abnormal gait, Decreased range of motion, Decreased strength, Pain, Decreased balance, Decreased endurance, Decreased mobility, Decreased activity tolerance, Difficulty walking  Visit Diagnosis: Chronic pain of left knee  Muscle weakness (generalized)     Problem List Patient Active Problem List   Diagnosis Date Noted   Mixed incontinence 03/10/2021   Class 1 obesity due to excess calories with body mass index (BMI) of 31.0 to 31.9 in adult 02/13/2021   Primary osteoarthritis of left knee 06/18/2020   Obstructive sleep apnea 07/18/2019   Hyperlipidemia 06/21/2019   Hypertension 05/23/2019    05/25/2019 PT, DPT, GCS  Earlene Bjelland, PT 04/07/2021, 12:57 PM  [place] [facility] [address]. [place], [place], [place] Phone: [phone]   Fax:  [phone]  Name: Darlene Mcdaniel MRN: [name] Date of Birth: 1950/03/30

## 2021-04-08 NOTE — Telephone Encounter (Signed)
The product is not covered under the medical and pharmacy plan.  For your information.  ?

## 2021-04-09 ENCOUNTER — Ambulatory Visit: Payer: Medicare Other

## 2021-04-09 ENCOUNTER — Other Ambulatory Visit: Payer: Self-pay

## 2021-04-09 DIAGNOSIS — R2689 Other abnormalities of gait and mobility: Secondary | ICD-10-CM | POA: Diagnosis not present

## 2021-04-09 DIAGNOSIS — M25562 Pain in left knee: Secondary | ICD-10-CM | POA: Diagnosis not present

## 2021-04-09 DIAGNOSIS — M6281 Muscle weakness (generalized): Secondary | ICD-10-CM | POA: Diagnosis not present

## 2021-04-09 DIAGNOSIS — G8929 Other chronic pain: Secondary | ICD-10-CM | POA: Diagnosis not present

## 2021-04-09 NOTE — Therapy (Signed)
[facility] [address] [place], [place], [address] Phone: [phone]   Fax:  [phone]  Physical Therapy Treatment  Patient Details  Name: Darlene BoozeDeborah Mcdaniel MRN: [id number] Date of Birth: 1950/11/04 Referring Provider (PT): Dr. Ashley RoyaltyMatthews   Encounter Date: 04/09/2021   PT End of Session - 04/09/21 1053     Visit Number 4    Number of Visits 13    Date for PT Re-Evaluation 05/09/21    Authorization Type eval: 03/28/21    PT Start Time 1100    PT Stop Time 1145    PT Time Calculation (min) 45 min    Activity Tolerance Patient tolerated treatment well    Behavior During Therapy [facility] for tasks assessed/performed               Past Medical History:  Diagnosis Date   Hyperlipidemia    Hypertension     History reviewed. No pertinent surgical history.  There were no vitals filed for this visit.   Subjective Assessment - 04/09/21 1053     Subjective Pt reports no pain upon arrival. No excessive soreness after the last therapy session. No significant changes recently. No specific questions or concerns upon arrival.    Pertinent History Pt has history of L knee pain that started ~1 year ago and began gradually getting worse. ~6 weeks ago pain got so intense that the throbbing would wake her up from her sleep in the middle of the night. Went to go see her GP who referred her to Dr. Ashley RoyaltyMatthews who gave her an injection in her knee on 02/24/21. Since the injection her knee pain has significantly decreased, but she has been experiencing some buckling in her knee. She reports no falls with the buckling. Pt reports still occasionally feeling the pain in her L knee, but no where near as bad as it had been before the injection. Prior to the injection the pt stated that she was an entirely different person due to the amount of pain she was in. Prior to the injection the pain at its worst would reach a 10/10 on the NPRS. Pt reports the pain was  constant and throbbing. Pt stated that in the mornings her knee would be stiff and painful, but after she took a hot shower and started moving it would ease up, then after some activity the pain would increase again. To ease the pain the pt mainly just tried to stay off of the leg. Pt stated that when she did have to walk she "walked like a rocking chair". Pt still can not walk for long durations, but she reports that it is more due to fatigue than pain. Pt has no previous back, hip, knee or ankle injuries that she can recall. Pt describes herself as sedentary and does no perform much physical activity. Pt enjoys reading and would like to be able to walk around [place] and the grocery store. Pt lives in a single level home with no stairs.    Limitations Walking;Lifting;Standing;House hold activities    How long can you stand comfortably? Not very long due to fatigue, but does not know exact time frame    How long can you walk comfortably? Not very long due to fatigue, but does not know exact time frame    Patient Stated Goals Walking longer distances to be able to go shopping, squatting down to do functional tasks like checking tire pressure    Currently in Pain? No/denies  TREATMENT   There Ex: NuStep (seat 5, arms 8) while getting interim history L1-2 x 5 min for warm-up (4 minutes unbilled); L SLR with 2# ankle weight (AW) 2 x 15; L hamstring stretch 30s x 2; Hooklying clams with manual resistance 2 x 15; Hooklying adductor squeeze with manual resistance 2 x 15; Supine L SAQ with manual resistance from therapist 2 x 15; Hooklying bridges 2 x 10, cues for glute squeeze to avoid lumbar extension; R sidelying L hip abduction with 2# AW 2 x 15; R sidelying L reverse clams with 2# AW 2 x 15; Total Gym (TG) level 22 (L22) 2 x 15; TG L22 heel raises 2 x 15; Nautilus 50# resisted gait forward, R lateral, L lateral, and backwards x 2 each direction;   Pt educated throughout  session about proper posture and technique with exercises. Improved exercise technique, movement at target joints, use of target muscles after min to mod verbal, visual, tactile cues.    Focused session on gentle stretching and light strengthening. Continued mat table strengthening by adding a 2# ankle weight and limited WB strengthening to minimize L knee pain. She denies any pain in low back during bridges today. Introduced resisted gait and pt is able to complete but does struggle with some weakness and instability during lateral stepping. Will continue to progress pain-free strengthening in future sessions. Pt would benefit from continued PT services in order to address deficits in ROM, gait, and LE strength in order to improve daily function and quality of life.                        PT Short Term Goals - 03/28/21 1204       PT SHORT TERM GOAL #1   Title Pt will be independent with HEP in order to decrease L knee pain and increase strength in order to improve pain-free function at home.    Time 3    Period Weeks    Status New    Target Date 04/18/21               PT Long Term Goals - 03/28/21 1247       PT LONG TERM GOAL #1   Title Pt will decrease 5TSTS time by at least 3s in order to demonstrate clinically significant improvement in LE strength    Baseline 03/28/21: 15.97s    Time 6    Period Weeks    Status New    Target Date 05/09/21      PT LONG TERM GOAL #2   Title Pt will increase by at least 0.13 m/s in order to demonstrate clinically significant improvement in household and community ambulation to decrease risk for falls.    Baseline 03/28/21: 0.93m/s    Time 6    Period Weeks    Status New    Target Date 05/09/21      PT LONG TERM GOAL #3   Title Pt will increase L knee extension to 0 deg. in order to improve functional ability in walking and standing    Baseline 03/28/21: Lacking 5 deg form TKE    Time 6    Period Weeks    Status  New    Target Date 05/09/21      PT LONG TERM GOAL #4   Title Pt will increase FOTO to at least 64 (target score) in order to demonstrate signifcant improvement in function related to L knee pain and  buckling.    Baseline 03/28/21: 50    Time 6    Period Weeks    Status New    Target Date 05/09/21                   Plan - 04/09/21 1105     Clinical Impression Statement Focused session on gentle stretching and light strengthening. Continued mat table strengthening by adding a 2# ankle weight and limited WB strengthening to minimize L knee pain. She denies any pain in low back during bridges today. Introduced resisted gait and pt is able to complete but does struggle with some weakness and instability during lateral stepping. Will continue to progress pain-free strengthening in future sessions. Pt would benefit from continued PT services in order to address deficits in ROM, gait, and LE strength in order to improve daily function and quality of life.    Personal Factors and Comorbidities Fitness;Comorbidity 3+;Past/Current Experience;Time since onset of injury/illness/exacerbation    Comorbidities Knee OA    Examination-Activity Limitations Stand;Locomotion Level;Squat    Examination-Participation Restrictions Shop;Community Activity;Yard Work    Stability/Clinical Decision Making Stable/Uncomplicated    Rehab Potential Good    PT Frequency 2x / week    PT Duration 6 weeks    PT Treatment/Interventions Electrical Stimulation;Cryotherapy;Iontophoresis 4mg /ml Dexamethasone;Ultrasound;Gait training;Functional mobility training;Therapeutic activities;Therapeutic exercise;Balance training;Manual techniques;Passive range of motion;Dry needling;Vestibular;Spinal Manipulations;Joint Manipulations;ADLs/Self Care Home Management;Aquatic Therapy;Moist Heat;Stair training;Neuromuscular re-education;Patient/family education    PT Next Visit Plan Reassess HEP, Functional strengthening, stretching  hamstrings and hip flexors    PT Home Exercise Plan    Consulted and Agree with Plan of Care Patient               Patient will benefit from skilled therapeutic intervention in order to improve the following deficits and impairments:  Abnormal gait, Decreased range of motion, Decreased strength, Pain, Decreased balance, Decreased endurance, Decreased mobility, Decreased activity tolerance, Difficulty walking  Visit Diagnosis: Chronic pain of left knee  Muscle weakness (generalized)  Other abnormalities of gait and mobility     Problem List Patient Active Problem List   Diagnosis Date Noted   Mixed incontinence 03/10/2021   Class 1 obesity due to excess calories with body mass index (BMI) of 31.0 to 31.9 in adult 02/13/2021   Primary osteoarthritis of left knee 06/18/2020   Obstructive sleep apnea 07/18/2019   Hyperlipidemia 06/21/2019   Hypertension 05/23/2019    05/25/2019 PT, DPT, GCS  Evonte Prestage, PT 04/10/2021, 10:44 AM  [facility] [address] [place], [place], [place] Phone: [phone]   Fax:  [phone]  Name: Darlene Mcdaniel MRN: Darlene Mcdaniel Date of Birth: 08-18-1950

## 2021-04-09 NOTE — Telephone Encounter (Signed)
Patient notified and verbalized understanding.  Patient has started PT twice weekly and is doing well.  Will follow-up as needed. ?

## 2021-04-13 NOTE — Therapy (Signed)
[facility] [address] [place], [place], [address] Phone: [phone]   Fax:  [phone]  Physical Therapy Treatment  Patient Details  Name: Darlene Mcdaniel MRN: [id number] Date of Birth: 08/16/1950 Referring Provider (PT): Dr. Ashley Royalty   Encounter Date: 04/14/2021   PT End of Session - 04/14/21 2116     Visit Number 5    Number of Visits 13    Date for PT Re-Evaluation 05/09/21    Authorization Type eval: 03/28/21    PT Start Time 1357    PT Stop Time 1442    PT Time Calculation (min) 45 min    Activity Tolerance Patient tolerated treatment well    Behavior During Therapy [facility] for tasks assessed/performed                Past Medical History:  Diagnosis Date   Hyperlipidemia    Hypertension     History reviewed. No pertinent surgical history.  There were no vitals filed for this visit.   Subjective Assessment - 04/14/21 1358     Subjective Pt reports no pain upon arrival. No excessive soreness after the last therapy session. No significant changes recently. No specific questions or concerns upon arrival.    Pertinent History Pt has history of L knee pain that started ~1 year ago and began gradually getting worse. ~6 weeks ago pain got so intense that the throbbing would wake her up from her sleep in the middle of the night. Went to go see her GP who referred her to Dr. Ashley Royalty who gave her an injection in her knee on 02/24/21. Since the injection her knee pain has significantly decreased, but she has been experiencing some buckling in her knee. She reports no falls with the buckling. Pt reports still occasionally feeling the pain in her L knee, but no where near as bad as it had been before the injection. Prior to the injection the pt stated that she was an entirely different person due to the amount of pain she was in. Prior to the injection the pain at its worst would reach a 10/10 on the NPRS. Pt reports the pain was  constant and throbbing. Pt stated that in the mornings her knee would be stiff and painful, but after she took a hot shower and started moving it would ease up, then after some activity the pain would increase again. To ease the pain the pt mainly just tried to stay off of the leg. Pt stated that when she did have to walk she "walked like a rocking chair". Pt still can not walk for long durations, but she reports that it is more due to fatigue than pain. Pt has no previous back, hip, knee or ankle injuries that she can recall. Pt describes herself as sedentary and does no perform much physical activity. Pt enjoys reading and would like to be able to walk around [place] and the grocery store. Pt lives in a single level home with no stairs.    Limitations Walking;Lifting;Standing;House hold activities    How long can you stand comfortably? Not very long due to fatigue, but does not know exact time frame    How long can you walk comfortably? Not very long due to fatigue, but does not know exact time frame    Patient Stated Goals Walking longer distances to be able to go shopping, squatting down to do functional tasks like checking tire pressure  TREATMENT   There Ex Supine L SLR with 3# ankle weight (AW) x 15, x 10; Hooklying clams with green tband 2 x 20; Hooklying adductor ball squeeze 2 x 20; Hooklying bridges 2 x 10, cues for glute squeeze to avoid lumbar extension; L hamstring stretch 30s x 2; L single knee to chest stretch 30s x 2; R sidelying L hip abduction with 3# AW 2 x 10; R sidelying L reverse clams with 3# AW 2 x 10; Prone L hamstring curl with 3# AW 2 x 20; Prone straight knee L hip extension, un weighted 2 x 10; Supine L SAQ with manual resistance from therapist 2 x 20; Total Gym (TG) level 22 (L22) x 20, x 25; TG L22 heel raises 2 x 20    Pt educated throughout session about proper posture and technique with exercises. Improved exercise technique, movement  at target joints, use of target muscles after min to mod verbal, visual, tactile cues.    Focused session on gentle stretching and light strengthening. Continued mat table strengthening and increased ankle weight to 3 pounds. She continues to deny any L knee pain during session. Repeated Total Gym heel raises and squats today for partial weightbearing closed chain strengthening. Will continue to progress pain-free strengthening in future sessions. Pt would benefit from continued PT services in order to address deficits in ROM, gait, and LE strength in order to improve daily function and quality of life.                        PT Short Term Goals - 03/28/21 1204       PT SHORT TERM GOAL #1   Title Pt will be independent with HEP in order to decrease L knee pain and increase strength in order to improve pain-free function at home.    Time 3    Period Weeks    Status New    Target Date 04/18/21               PT Long Term Goals - 03/28/21 1247       PT LONG TERM GOAL #1   Title Pt will decrease 5TSTS time by at least 3s in order to demonstrate clinically significant improvement in LE strength    Baseline 03/28/21: 15.97s    Time 6    Period Weeks    Status New    Target Date 05/09/21      PT LONG TERM GOAL #2   Title Pt will increase by at least 0.13 m/s in order to demonstrate clinically significant improvement in household and community ambulation to decrease risk for falls.    Baseline 03/28/21: 0.48m/s    Time 6    Period Weeks    Status New    Target Date 05/09/21      PT LONG TERM GOAL #3   Title Pt will increase L knee extension to 0 deg. in order to improve functional ability in walking and standing    Baseline 03/28/21: Lacking 5 deg form TKE    Time 6    Period Weeks    Status New    Target Date 05/09/21      PT LONG TERM GOAL #4   Title Pt will increase FOTO to at least 64 (target score) in order to demonstrate signifcant improvement in  function related to L knee pain and buckling.    Baseline 03/28/21: 50    Time 6  Period Weeks    Status New    Target Date 05/09/21                   Plan - 04/14/21 2116     Clinical Impression Statement Focused session on gentle stretching and light strengthening. Continued mat table strengthening and increased ankle weight to 3 pounds. She continues to deny any L knee pain during session. Repeated Total Gym heel raises and squats today for partial weightbearing closed chain strengthening. Will continue to progress pain-free strengthening in future sessions. Pt would benefit from continued PT services in order to address deficits in ROM, gait, and LE strength in order to improve daily function and quality of life.    Personal Factors and Comorbidities Fitness;Comorbidity 3+;Past/Current Experience;Time since onset of injury/illness/exacerbation    Comorbidities Knee OA    Examination-Activity Limitations Stand;Locomotion Level;Squat    Examination-Participation Restrictions Shop;Community Activity;Yard Work    Stability/Clinical Decision Making Stable/Uncomplicated    Rehab Potential Good    PT Frequency 2x / week    PT Duration 6 weeks    PT Treatment/Interventions Electrical Stimulation;Cryotherapy;Iontophoresis 4mg /ml Dexamethasone;Ultrasound;Gait training;Functional mobility training;Therapeutic activities;Therapeutic exercise;Balance training;Manual techniques;Passive range of motion;Dry needling;Vestibular;Spinal Manipulations;Joint Manipulations;ADLs/Self Care Home Management;Aquatic Therapy;Moist Heat;Stair training;Neuromuscular re-education;Patient/family education    PT Next Visit Plan Reassess HEP, Functional strengthening, stretching hamstrings and hip flexors    PT Home Exercise Plan [id number]    Consulted and Agree with Plan of Care Patient                Patient will benefit from skilled therapeutic intervention in order to improve the following deficits  and impairments:  Abnormal gait, Decreased range of motion, Decreased strength, Pain, Decreased balance, Decreased endurance, Decreased mobility, Decreased activity tolerance, Difficulty walking  Visit Diagnosis: Chronic pain of left knee  Muscle weakness (generalized)  Other abnormalities of gait and mobility     Problem List Patient Active Problem List   Diagnosis Date Noted   Mixed incontinence 03/10/2021   Class 1 obesity due to excess calories with body mass index (BMI) of 31.0 to 31.9 in adult 02/13/2021   Primary osteoarthritis of left knee 06/18/2020   Obstructive sleep apnea 07/18/2019   Hyperlipidemia 06/21/2019   Hypertension 05/23/2019    Lynnea MaizesJason D Orrie Lascano PT, DPT, GCS  Samson Ralph, PT 04/14/2021, 9:24 PM  [facility] [address] [place], [place], [address] Phone: [phone]   Fax:  [phone]  Name: Jettie BoozeDeborah Demaree MRN: [id number] Date of Birth: 03/27/1950

## 2021-04-14 ENCOUNTER — Ambulatory Visit: Payer: Medicare Other

## 2021-04-14 ENCOUNTER — Other Ambulatory Visit: Payer: Self-pay

## 2021-04-14 DIAGNOSIS — M6281 Muscle weakness (generalized): Secondary | ICD-10-CM

## 2021-04-14 DIAGNOSIS — R2689 Other abnormalities of gait and mobility: Secondary | ICD-10-CM | POA: Diagnosis not present

## 2021-04-14 DIAGNOSIS — M25562 Pain in left knee: Secondary | ICD-10-CM | POA: Diagnosis not present

## 2021-04-14 DIAGNOSIS — G8929 Other chronic pain: Secondary | ICD-10-CM

## 2021-04-15 NOTE — Therapy (Signed)
[place] ?[facility] ?[address] [place], [place], [address] ?Phone: [phone]   Fax:  [phone] ? ?Physical Therapy Treatment ? ?Patient Details  ?Name: Darlene Mcdaniel ?MRN: [id number] ?Date of Birth: 1950-12-31 ?Referring Provider (PT): Dr. Ashley Royalty ? ? ?Encounter Date: 04/16/2021 ? ? PT End of Session - 04/16/21 1353   ? ? Visit Number 6   ? Number of Visits 13   ? Date for PT Re-Evaluation 05/09/21   ? Authorization Type eval: 03/28/21   ? PT Start Time 1352   ? PT Stop Time 1437   ? PT Time Calculation (min) 45 min   ? Activity Tolerance Patient tolerated treatment well   ? Behavior During Therapy [facility] for tasks assessed/performed   ? ?  ?  ? ?  ? ? ? ? ? ? ?Past Medical History:  ?Diagnosis Date  ? Hyperlipidemia   ? Hypertension   ? ? ?History reviewed. No pertinent surgical history. ? ?There were no vitals filed for this visit. ? ? Subjective Assessment - 04/16/21 1353   ? ? Subjective Pt reports no pain upon arrival. No excessive soreness after the last therapy session. She did have a rough day yesterday with a lot of knee pain but resolved today. No specific questions or concerns upon arrival.   ? Pertinent History Pt has history of L knee pain that started ~1 year ago and began gradually getting worse. ~6 weeks ago pain got so intense that the throbbing would wake her up from her sleep in the middle of the night. Went to go see her GP who referred her to Dr. Ashley Royalty who gave her an injection in her knee on 02/24/21. Since the injection her knee pain has significantly decreased, but she has been experiencing some buckling in her knee. She reports no falls with the buckling. Pt reports still occasionally feeling the pain in her L knee, but no where near as bad as it had been before the injection. Prior to the injection the pt stated that she was an entirely different person due to the amount of pain she was in. Prior to the injection the pain at its worst would  reach a 10/10 on the NPRS. Pt reports the pain was constant and throbbing. Pt stated that in the mornings her knee would be stiff and painful, but after she took a hot shower and started moving it would ease up, then after some activity the pain would increase again. To ease the pain the pt mainly just tried to stay off of the leg. Pt stated that when she did have to walk she "walked like a rocking chair". Pt still can not walk for long durations, but she reports that it is more due to fatigue than pain. Pt has no previous back, hip, knee or ankle injuries that she can recall. Pt describes herself as sedentary and does no perform much physical activity. Pt enjoys reading and would like to be able to walk around [place] and the grocery store. Pt lives in a single level home with no stairs.   ? Limitations Walking;Lifting;Standing;House hold activities   ? How long can you stand comfortably? Not very long due to fatigue, but does not know exact time frame   ? How long can you walk comfortably? Not very long due to fatigue, but does not know exact time frame   ? Patient Stated Goals Walking longer distances to be able to go shopping, squatting down to do  functional tasks like checking tire pressure   ? Currently in Pain? No/denies   ? ?  ?  ? ?  ? ? ? ? ? ?TREATMENT ? ? ?There Ex ?NuStep L2 x 5 minutes for warm-up during interval history (4 minutes unbilled); ?Supine L SLR with 3# ankle weight (AW) 2 x 15; ?Hooklying clams with green tband 2 x 15; ?Hooklying adductor ball squeeze 2 x 15;; ?Hooklying bridges 2 x 10, cues for glute squeeze to avoid lumbar extension; ?L hamstring stretch 30s x 2; ?L single knee to chest stretch 30s x 2; ?R sidelying L hip abduction with 3# AW 2 x 15; ?R sidelying L reverse clams with 3# AW 2 x 15; ?Prone L hamstring curl with 3# AW 2 x 20; ?Prone straight knee L hip extension, un weighted 2 x 10; ?Seated L LAQ with manual resistance from therapist 2 x 10; ? ? ?Pt educated throughout  session about proper posture and technique with exercises. Improved exercise technique, movement at target joints, use of target muscles after min to mod verbal, visual, tactile cues.  ? ? ?Pt with more pain yesterday however improved today and no pain reported during session. Continued to focus session on gentle stretching and light strengthening. Continued mat table strengthening and continued with 3 pound ankle weights as this was challenging during last session. Repeated Total Gym heel raises and squats today for partial weightbearing closed chain strengthening. Will continue to progress pain-free strengthening in future sessions. Pt would benefit from continued PT services in order to address deficits in ROM, gait, and LE strength in order to improve daily function and quality of life. ? ? ? ? ? ? ? ? ? ? ? ? ? ? ? ? ? ? ? ? ? ? ? PT Short Term Goals - 03/28/21 1204   ? ?  ? PT SHORT TERM GOAL #1  ? Title Pt will be independent with HEP in order to decrease L knee pain and increase strength in order to improve pain-free function at home.   ? Time 3   ? Period Weeks   ? Status New   ? Target Date 04/18/21   ? ?  ?  ? ?  ? ? ? ? PT Long Term Goals - 03/28/21 1247   ? ?  ? PT LONG TERM GOAL #1  ? Title Pt will decrease 5TSTS time by at least 3s in order to demonstrate clinically significant improvement in LE strength   ? Baseline 03/28/21: 15.97s   ? Time 6   ? Period Weeks   ? Status New   ? Target Date 05/09/21   ?  ? PT LONG TERM GOAL #2  ? Title Pt will increase 10MWT by at least 0.13 m/s in order to demonstrate clinically significant improvement in household and community ambulation to decrease risk for falls.   ? Baseline 03/28/21: 0.10620m/s   ? Time 6   ? Period Weeks   ? Status New   ? Target Date 05/09/21   ?  ? PT LONG TERM GOAL #3  ? Title Pt will increase L knee extension to 0 deg. in order to improve functional ability in walking and standing   ? Baseline 03/28/21: Lacking 5 deg form TKE   ? Time 6   ?  Period Weeks   ? Status New   ? Target Date 05/09/21   ?  ? PT LONG TERM GOAL #4  ? Title Pt will increase FOTO  to at least 64 (target score) in order to demonstrate signifcant improvement in function related to L knee pain and buckling.   ? Baseline 03/28/21: 50   ? Time 6   ? Period Weeks   ? Status New   ? Target Date 05/09/21   ? ?  ?  ? ?  ? ? ? ? ? ? ? ? Plan - 04/16/21 1354   ? ? Clinical Impression Statement Pt with more pain yesterday however improved today and no pain reported during session. Continued to focus session on gentle stretching and light strengthening. Continued mat table strengthening and continued with 3 pound ankle weights as this was challenging during last session. Repeated Total Gym heel raises and squats today for partial weightbearing closed chain strengthening. Will continue to progress pain-free strengthening in future sessions. Pt would benefit from continued PT services in order to address deficits in ROM, gait, and LE strength in order to improve daily function and quality of life.   ? Personal Factors and Comorbidities Fitness;Comorbidity 3+;Past/Current Experience;Time since onset of injury/illness/exacerbation   ? Comorbidities Knee OA   ? Examination-Activity Limitations Stand;Locomotion Level;Squat   ? Examination-Participation Restrictions Shop;Community Activity;Pincus Badder Work   ? Stability/Clinical Decision Making Stable/Uncomplicated   ? Rehab Potential Good   ? PT Frequency 2x / week   ? PT Duration 6 weeks   ? PT Treatment/Interventions Electrical Stimulation;Cryotherapy;Iontophoresis 4mg /ml Dexamethasone;Ultrasound;Gait training;Functional mobility training;Therapeutic activities;Therapeutic exercise;Balance training;Manual techniques;Passive range of motion;Dry needling;Vestibular;Spinal Manipulations;Joint Manipulations;ADLs/Self Care Home Management;Aquatic Therapy;Moist Heat;Stair training;Neuromuscular re-education;Patient/family education   ? PT Next Visit Plan Reassess  HEP, Functional strengthening, stretching hamstrings and hip flexors   ? PT Home Exercise Plan   ? Consulted and Agree with Plan of Care Patient   ? ?  ?  ? ?  ? ? ? ? ? ? ?Patient will benefit from skil

## 2021-04-16 ENCOUNTER — Ambulatory Visit: Payer: Medicare Other

## 2021-04-16 ENCOUNTER — Other Ambulatory Visit: Payer: Self-pay

## 2021-04-16 DIAGNOSIS — G8929 Other chronic pain: Secondary | ICD-10-CM

## 2021-04-16 DIAGNOSIS — M6281 Muscle weakness (generalized): Secondary | ICD-10-CM

## 2021-04-16 DIAGNOSIS — M25562 Pain in left knee: Secondary | ICD-10-CM | POA: Diagnosis not present

## 2021-04-16 DIAGNOSIS — R2689 Other abnormalities of gait and mobility: Secondary | ICD-10-CM | POA: Diagnosis not present

## 2021-04-21 ENCOUNTER — Ambulatory Visit: Payer: Medicare Other

## 2021-04-23 ENCOUNTER — Ambulatory Visit: Payer: Medicare Other

## 2021-04-23 ENCOUNTER — Other Ambulatory Visit: Payer: Self-pay

## 2021-04-23 DIAGNOSIS — M6281 Muscle weakness (generalized): Secondary | ICD-10-CM | POA: Diagnosis not present

## 2021-04-23 DIAGNOSIS — R2689 Other abnormalities of gait and mobility: Secondary | ICD-10-CM | POA: Diagnosis not present

## 2021-04-23 DIAGNOSIS — G8929 Other chronic pain: Secondary | ICD-10-CM | POA: Diagnosis not present

## 2021-04-23 DIAGNOSIS — M25562 Pain in left knee: Secondary | ICD-10-CM | POA: Diagnosis not present

## 2021-04-23 NOTE — Therapy (Signed)
[place] ?[facility] ?[address] [place], [place], [address] ?Phone: [phone]   Fax:  [phone] ? ?Physical Therapy Treatment/Goal Update ? ?Patient Details  ?Name: Darlene Mcdaniel ?MRN: [id number] ?Date of Birth: 1950-04-24 ?Referring Provider (PT): Dr. Zigmund Daniel ? ? ?Encounter Date: 04/23/2021 ? ? PT End of Session - 04/23/21 1011   ? ? Visit Number 7   ? Number of Visits 13   ? Date for PT Re-Evaluation 05/09/21   ? Authorization Type eval: 03/28/21   ? PT Start Time 1015   ? PT Stop Time 1100   ? PT Time Calculation (min) 45 min   ? Activity Tolerance Patient tolerated treatment well   ? Behavior During Therapy [facility] for tasks assessed/performed   ? ?  ?  ? ?  ? ? ? ? ? ? ?Past Medical History:  ?Diagnosis Date  ? Hyperlipidemia   ? Hypertension   ? ? ?History reviewed. No pertinent surgical history. ? ?There were no vitals filed for this visit. ? ? Subjective Assessment - 04/23/21 1011   ? ? Subjective Pt reports no pain upon arrival but some stiffness in knee today. No excessive soreness after the last therapy session. No specific questions or concerns upon arrival.   ? Pertinent History Pt has history of L knee pain that started ~1 year ago and began gradually getting worse. ~6 weeks ago pain got so intense that the throbbing would wake her up from her sleep in the middle of the night. Went to go see her GP who referred her to Dr. Zigmund Daniel who gave her an injection in her knee on 02/24/21. Since the injection her knee pain has significantly decreased, but she has been experiencing some buckling in her knee. She reports no falls with the buckling. Pt reports still occasionally feeling the pain in her L knee, but no where near as bad as it had been before the injection. Prior to the injection the pt stated that she was an entirely different person due to the amount of pain she was in. Prior to the injection the pain at its worst would reach a 10/10 on the NPRS. Pt reports  the pain was constant and throbbing. Pt stated that in the mornings her knee would be stiff and painful, but after she took a hot shower and started moving it would ease up, then after some activity the pain would increase again. To ease the pain the pt mainly just tried to stay off of the leg. Pt stated that when she did have to walk she "walked like a rocking chair". Pt still can not walk for long durations, but she reports that it is more due to fatigue than pain. Pt has no previous back, hip, knee or ankle injuries that she can recall. Pt describes herself as sedentary and does no perform much physical activity. Pt enjoys reading and would like to be able to walk around [place] and the grocery store. Pt lives in a single level home with no stairs.   ? Limitations Walking;Lifting;Standing;House hold activities   ? How long can you stand comfortably? Not very long due to fatigue, but does not know exact time frame   ? How long can you walk comfortably? Not very long due to fatigue, but does not know exact time frame   ? Patient Stated Goals Walking longer distances to be able to go shopping, squatting down to do functional tasks like checking tire pressure   ?  Currently in Pain? No/denies   ? ?  ?  ? ?  ? ? ? ? ? ?TREATMENT ? ? ?There Ex ?NuStep L2 x 5 minutes for warm-up during interval history (4 minutes unbilled); ?Total Gym (TG) Level 22 (L22) double leg partial squats 2 x 10; ?TG L22 heel raises x 10, x 15; ? ?Updated outcome measures with patient: ?FOTO: 50 ?5TSTS: 14.6s ?31m gait speed: self-selected: 13.5s = 0.74 m/s, fastest: 10.0 = 1.0 m/s ?L knee ROM: 0 degrees ? ?Supine L SLR with 3# ankle weight (AW) 2 x 15; ?Hooklying clams with green tband 2 x 15; ?Hooklying adductor ball squeeze x 10, x 15;; ?Supine L SAQ over bolster with manual resistance from therapist 2 x 20; ?Hooklying bridges x 20, cues for glute squeeze to avoid lumbar extension; ? ? ?Pt educated throughout session about proper posture and  technique with exercises. Improved exercise technique, movement at target joints, use of target muscles after min to mod verbal, visual, tactile cues.  ? ? ?Updated outcome measures/goals with patient during session today.  Her FOTO score remains unchanged at 50 and her 73m gait speed also remains relatively unchanged at 0.74 m/seconds.  She is now able to achieve 0 degrees of extension in her left knee whereas at the initial evaluation she was lacking 5 degrees.  Her 5 Times Sit to Stand Test has decreased from 16.0 seconds to 14.6 seconds today.  Continued to focus additional time during session on light strengthening. Continued mat table strengthening.  Attempted to increase ankle weights to 4 pounds however this too challenging for patient so regressed back to 3 pounds. Repeated Total Gym heel raises and squats today for partial weightbearing closed chain strengthening. Will continue to progress pain-free strengthening in future sessions. Pt would benefit from continued PT services in order to address deficits in ROM, gait, and LE strength in order to improve daily function and quality of life. ? ? ? ? ? ? ? ? ? ? ? ? ? ? ? ? ? ? ? ? ? ? ? PT Short Term Goals - 04/23/21 1251   ? ?  ? PT SHORT TERM GOAL #1  ? Title Pt will be independent with HEP in order to decrease L knee pain and increase strength in order to improve pain-free function at home.   ? Time 3   ? Period Weeks   ? Status On-going   ? Target Date 05/09/21   ? ?  ?  ? ?  ? ? ? ? PT Long Term Goals - 04/23/21 1251   ? ?  ? PT LONG TERM GOAL #1  ? Title Pt will decrease 5TSTS time by at least 3s in order to demonstrate clinically significant improvement in LE strength   ? Baseline 03/28/21: 15.97s; 04/23/21: 14.6s   ? Time 6   ? Period Weeks   ? Status Partially Met   ? Target Date 05/09/21   ?  ? PT LONG TERM GOAL #2  ? Title Pt will increase 10MWT by at least 0.13 m/s in order to demonstrate clinically significant improvement in household and community  ambulation to decrease risk for falls.   ? Baseline 03/28/21: 0.66m/s; 04/23/21: self-selected: 0.74 m/s   ? Time 6   ? Period Weeks   ? Status On-going   ? Target Date 05/09/21   ?  ? PT LONG TERM GOAL #3  ? Title Pt will increase L knee extension to 0 deg. in order  to improve functional ability in walking and standing   ? Baseline 03/28/21: Lacking 5 deg form TKE; 04/23/21: 0 degrees   ? Time 6   ? Period Weeks   ? Status Achieved   ? Target Date 05/09/21   ?  ? PT LONG TERM GOAL #4  ? Title Pt will increase FOTO to at least 64 (target score) in order to demonstrate signifcant improvement in function related to L knee pain and buckling.   ? Baseline 03/28/21: 50; 04/23/21: 50   ? Time 6   ? Period Weeks   ? Status On-going   ? Target Date 05/09/21   ? ?  ?  ? ?  ? ? ? ? ? ? ? ? Plan - 04/23/21 1011   ? ? Clinical Impression Statement Updated outcome measures/goals with patient during session today.  Her FOTO score remains unchanged at 50 and her 81m gait speed also remains relatively unchanged at 0.74 m/seconds.  She is now able to achieve 0 degrees of extension in her left knee whereas at the initial evaluation she was lacking 5 degrees.  Her 5 Times Sit to Stand Test has decreased from 16.0 seconds to 14.6 seconds today.  Continued to focus additional time during session on light strengthening. Continued mat table strengthening.  Attempted to increase ankle weights to 4 pounds however this too challenging for patient so regressed back to 3 pounds. Repeated Total Gym heel raises and squats today for partial weightbearing closed chain strengthening. Will continue to progress pain-free strengthening in future sessions. Pt would benefit from continued PT services in order to address deficits in ROM, gait, and LE strength in order to improve daily function and quality of life.   ? Personal Factors and Comorbidities Fitness;Comorbidity 3+;Past/Current Experience;Time since onset of injury/illness/exacerbation   ?  Comorbidities Knee OA   ? Examination-Activity Limitations Stand;Locomotion Level;Squat   ? Examination-Participation Restrictions Shop;Community Activity;[place] Work   ? Stability/Clinical Decision Making Stable/

## 2021-04-28 ENCOUNTER — Ambulatory Visit: Payer: Medicare Other

## 2021-04-28 ENCOUNTER — Other Ambulatory Visit: Payer: Self-pay

## 2021-04-28 DIAGNOSIS — G8929 Other chronic pain: Secondary | ICD-10-CM | POA: Diagnosis not present

## 2021-04-28 DIAGNOSIS — M25562 Pain in left knee: Secondary | ICD-10-CM | POA: Diagnosis not present

## 2021-04-28 DIAGNOSIS — M6281 Muscle weakness (generalized): Secondary | ICD-10-CM

## 2021-04-28 DIAGNOSIS — R2689 Other abnormalities of gait and mobility: Secondary | ICD-10-CM | POA: Diagnosis not present

## 2021-04-28 NOTE — Therapy (Signed)
[place] ?[facility] ?[address] [place], [place], [address] ?Phone: [phone]   Fax:  [phone] ? ?Physical Therapy Treatment ? ?Patient Details  ?Name: Darlene Mcdaniel ?MRN: [id number] ?Date of Birth: 1950/10/28 ?Referring Provider (PT): Dr. Zigmund Daniel ? ? ?Encounter Date: 04/28/2021 ? ? PT End of Session - 04/28/21 1009   ? ? Visit Number 8   ? Number of Visits 13   ? Date for PT Re-Evaluation 05/09/21   ? Authorization Type eval: 03/28/21   ? PT Start Time 1015   ? PT Stop Time 1100   ? PT Time Calculation (min) 45 min   ? Activity Tolerance Patient tolerated treatment well   ? Behavior During Therapy [facility] for tasks assessed/performed   ? ?  ?  ? ?  ? ? ? ? ? ? ? ?Past Medical History:  ?Diagnosis Date  ? Hyperlipidemia   ? Hypertension   ? ? ?History reviewed. No pertinent surgical history. ? ?There were no vitals filed for this visit. ? ? Subjective Assessment - 04/28/21 1009   ? ? Subjective Pt reports no pain upon arrival but some stiffness in knee today. No excessive soreness after the last therapy session. She was able to squat down to air up a tire and did not have any pain but was also able to get back up which was encouraging to patient. No specific questions or concerns upon arrival.   ? Pertinent History Pt has history of L knee pain that started ~1 year ago and began gradually getting worse. ~6 weeks ago pain got so intense that the throbbing would wake her up from her sleep in the middle of the night. Went to go see her GP who referred her to Dr. Zigmund Daniel who gave her an injection in her knee on 02/24/21. Since the injection her knee pain has significantly decreased, but she has been experiencing some buckling in her knee. She reports no falls with the buckling. Pt reports still occasionally feeling the pain in her L knee, but no where near as bad as it had been before the injection. Prior to the injection the pt stated that she was an entirely different person  due to the amount of pain she was in. Prior to the injection the pain at its worst would reach a 10/10 on the NPRS. Pt reports the pain was constant and throbbing. Pt stated that in the mornings her knee would be stiff and painful, but after she took a hot shower and started moving it would ease up, then after some activity the pain would increase again. To ease the pain the pt mainly just tried to stay off of the leg. Pt stated that when she did have to walk she "walked like a rocking chair". Pt still can not walk for long durations, but she reports that it is more due to fatigue than pain. Pt has no previous back, hip, knee or ankle injuries that she can recall. Pt describes herself as sedentary and does no perform much physical activity. Pt enjoys reading and would like to be able to walk around [place] and the grocery store. Pt lives in a single level home with no stairs.   ? Limitations Walking;Lifting;Standing;House hold activities   ? How long can you stand comfortably? Not very long due to fatigue, but does not know exact time frame   ? How long can you walk comfortably? Not very long due to fatigue, but does not know exact  time frame   ? Patient Stated Goals Walking longer distances to be able to go shopping, squatting down to do functional tasks like checking tire pressure   ? Currently in Pain? No/denies   ? ?  ?  ? ?  ? ? ? ? ? ? ?TREATMENT ? ? ?There Ex ?NuStep (seat 5, arms 8) L2-3 x 5 minutes for warm-up during interval history (4 minutes unbilled); ?Attempted TRX partial squats however secondary to increase in L knee pain discontinued; ?Total Gym (TG) Level 22 (L22) double leg partial squats 2 x 10; ?TG L22 heel raises 2 x 15; ?Supine L SLR with 3# ankle weight (AW) x 15, x 10; ?Hooklying clams with green tband 2 x 20; ?Hooklying adductor ball squeeze x 20; ?Hooklying bridges x 10, with green tband around knees to activate ER/abductors during bridges; ?L hamstring stretch 45s x 2; ?R sidelying L hip  abduction with 3# AW x 15, x 10; ?R sidelying L reverse clams with 3# AW x 10, x 13; ?Prone L hamstring curl with 3# AW x 15; ?Seated L LAQ with manual resistance from therapist x 15; ? ? ?Pt educated throughout session about proper posture and technique with exercises. Improved exercise technique, movement at target joints, use of target muscles after min to mod verbal, visual, tactile cues.  ? ? ?Attempted TRX partial squats during session however secondary to increase in L knee pain discontinued and regressed back to Total Gym strengthening in partial weightbearing. Additional exercises today perform in nonweightbearing or partial weightbearing positions to decrease/minimize pain. HEP updated with patient during session today. Will continue to progress pain-free strengthening in future sessions. Pt would benefit from continued PT services in order to address deficits in ROM, gait, and LE strength in order to improve daily function and quality of life. ? ? ? ? ? ? ? ? ? ? ? ? ? ? ? ? ? ? ? ? ? ? ? PT Short Term Goals - 04/23/21 1251   ? ?  ? PT SHORT TERM GOAL #1  ? Title Pt will be independent with HEP in order to decrease L knee pain and increase strength in order to improve pain-free function at home.   ? Time 3   ? Period Weeks   ? Status On-going   ? Target Date 05/09/21   ? ?  ?  ? ?  ? ? ? ? PT Long Term Goals - 04/23/21 1251   ? ?  ? PT LONG TERM GOAL #1  ? Title Pt will decrease 5TSTS time by at least 3s in order to demonstrate clinically significant improvement in LE strength   ? Baseline 03/28/21: 15.97s; 04/23/21: 14.6s   ? Time 6   ? Period Weeks   ? Status Partially Met   ? Target Date 05/09/21   ?  ? PT LONG TERM GOAL #2  ? Title Pt will increase 10MWT by at least 0.13 m/s in order to demonstrate clinically significant improvement in household and community ambulation to decrease risk for falls.   ? Baseline 03/28/21: 0.59ms; 04/23/21: self-selected: 0.74 m/s   ? Time 6   ? Period Weeks   ? Status  On-going   ? Target Date 05/09/21   ?  ? PT LONG TERM GOAL #3  ? Title Pt will increase L knee extension to 0 deg. in order to improve functional ability in walking and standing   ? Baseline 03/28/21: Lacking 5 deg form TKE; 04/23/21: 0  degrees   ? Time 6   ? Period Weeks   ? Status Achieved   ? Target Date 05/09/21   ?  ? PT LONG TERM GOAL #4  ? Title Pt will increase FOTO to at least 64 (target score) in order to demonstrate signifcant improvement in function related to L knee pain and buckling.   ? Baseline 03/28/21: 50; 04/23/21: 50   ? Time 6   ? Period Weeks   ? Status On-going   ? Target Date 05/09/21   ? ?  ?  ? ?  ? ? ? ? ? ? ? ? Plan - 04/28/21 1009   ? ? Clinical Impression Statement Attempted TRX partial squats during session however secondary to increase in L knee pain discontinued and regressed back to Total Gym strengthening in partial weightbearing. Additional exercises today perform in nonweightbearing or partial weightbearing positions to decrease/minimize pain. HEP updated with patient during session today. Will continue to progress pain-free strengthening in future sessions. Pt would benefit from continued PT services in order to address deficits in ROM, gait, and LE strength in order to improve daily function and quality of life.   ? Personal Factors and Comorbidities Fitness;Comorbidity 3+;Past/Current Experience;Time since onset of injury/illness/exacerbation   ? Comorbidities Knee OA   ? Examination-Activity Limitations Stand;Locomotion Level;Squat   ? Examination-Participation Restrictions Shop;Community Activity;Valla Leaver Work   ? Stability/Clinical Decision Making Stable/Uncomplicated   ? Rehab Potential Good   ? PT Frequency 2x / week   ? PT Duration 6 weeks   ? PT Treatment/Interventions Electrical Stimulation;Cryotherapy;Iontophoresis 38m/ml Dexamethasone;Ultrasound;Gait training;Functional mobility training;Therapeutic activities;Therapeutic exercise;Balance training;Manual techniques;Passive  range of motion;Dry needling;Vestibular;Spinal Manipulations;Joint Manipulations;ADLs/Self Care Home Management;Aquatic Therapy;Moist Heat;Stair training;Neuromuscular re-education;Patient/family education

## 2021-04-28 NOTE — Patient Instructions (Signed)
Access Code: [email or web address] ?URL: [email or web address] ?Date: 04/28/2021 ?Prepared by: Ria Comment ? ?Exercises ?- Long Sitting Quad Set with Towel Roll Under Heel  - 1 x daily - 7 x weekly - 2 sets - 15 reps - 3 hold ?- Supine Active Straight Leg Raise (Mirrored)  - 1 x daily - 7 x weekly - 3 sets - 10 reps - 3s hold ?- Supine Bridge with Gluteal Set and Spinal Articulation  - 1 x daily - 7 x weekly - 2 sets - 10 reps - 3s hold ?- Sidelying Hip Abduction (Mirrored)  - 1 x daily - 7 x weekly - 2 sets - 10 reps - 3s hold ?- Seated Long Arc Quad (Mirrored)  - 1 x daily - 7 x weekly - 2 sets - 10 reps - 3s hold ?

## 2021-04-30 ENCOUNTER — Ambulatory Visit: Payer: Medicare Other

## 2021-04-30 ENCOUNTER — Other Ambulatory Visit: Payer: Self-pay

## 2021-04-30 ENCOUNTER — Other Ambulatory Visit: Payer: Self-pay | Admitting: Family Medicine

## 2021-04-30 DIAGNOSIS — M6281 Muscle weakness (generalized): Secondary | ICD-10-CM

## 2021-04-30 DIAGNOSIS — G8929 Other chronic pain: Secondary | ICD-10-CM | POA: Diagnosis not present

## 2021-04-30 DIAGNOSIS — R2689 Other abnormalities of gait and mobility: Secondary | ICD-10-CM | POA: Diagnosis not present

## 2021-04-30 DIAGNOSIS — M25562 Pain in left knee: Secondary | ICD-10-CM | POA: Diagnosis not present

## 2021-04-30 DIAGNOSIS — M1712 Unilateral primary osteoarthritis, left knee: Secondary | ICD-10-CM

## 2021-04-30 NOTE — Therapy (Signed)
?OUTPATIENT PHYSICAL THERAPY TREATMENT NOTE ? ? ?Patient Name: Darlene Mcdaniel ?MRN: [id number] ?DOB:[birth year], 71 y.o., female ?Today's Date: 04/30/2021 ? ?PCP: Jearld Fenton, NP ?REFERRING PROVIDER: Montel Culver, MD ? ? PT End of Session - 04/30/21 1016   ? ? Visit Number 9   ? Number of Visits 13   ? Date for PT Re-Evaluation 05/09/21   ? Authorization Type eval: 03/28/21   ? PT Start Time 1015   ? PT Stop Time 1100   ? PT Time Calculation (min) 45 min   ? Activity Tolerance Patient tolerated treatment well   ? Behavior During Therapy [facility] for tasks assessed/performed   ? ?  ?  ? ?  ? ? ?Past Medical History:  ?Diagnosis Date  ? Hyperlipidemia   ? Hypertension   ? ?History reviewed. No pertinent surgical history. ?Patient Active Problem List  ? Diagnosis Date Noted  ? Mixed incontinence 03/10/2021  ? Class 1 obesity due to excess calories with body mass index (BMI) of 31.0 to 31.9 in adult 02/13/2021  ? Primary osteoarthritis of left knee 06/18/2020  ? Obstructive sleep apnea 07/18/2019  ? Hyperlipidemia 06/21/2019  ? Hypertension 05/23/2019  ? ? ?REFERRING DIAG: M17.12 (ICD-10-CM) - Primary osteoarthritis of left knee  ? ?THERAPY DIAG: Chronic pain of left knee ? ?Muscle weakness (generalized) ? ?PERTINENT HISTORY: Pt has history of L knee pain that started ~1 year ago and began gradually getting worse. ~6 weeks ago pain got so intense that the throbbing would wake her up from her sleep in the middle of the night. Went to go see her GP who referred her to Dr. Zigmund Daniel who gave her an injection in her knee on 02/24/21. Since the injection her knee pain has significantly decreased, but she has been experiencing some buckling in her knee. She reports no falls with the buckling. Pt reports still occasionally feeling the pain in her L knee, but no where near as bad as it had been before the injection. Prior to the injection the pt stated that she was an entirely different person due to the amount of pain she was  in. Prior to the injection the pain at its worst would reach a 10/10 on the NPRS. Pt reports the pain was constant and throbbing. Pt stated that in the mornings her knee would be stiff and painful, but after she took a hot shower and started moving it would ease up, then after some activity the pain would increase again. To ease the pain the pt mainly just tried to stay off of the leg. Pt stated that when she did have to walk she "walked like a rocking chair". Pt still can not walk for long durations, but she reports that it is more due to fatigue than pain. Pt has no previous back, hip, knee or ankle injuries that she can recall. Pt describes herself as sedentary and does no perform much physical activity. Pt enjoys reading and would like to be able to walk around [place] and the grocery store. Pt lives in a single level home with no stairs.  ? ?PRECAUTIONS: None ? ?SUBJECTIVE: Pt reports no pain or stiffness in her knee upon arrival. No excessive soreness after the last therapy session. No specific questions or concerns upon arrival ? ?PAIN:  ?Are you having pain? No ? ? ?TODAY'S TREATMENT:  ? ?   ?There Ex ?NuStep (seat 5, arms 7) L2-3 x 5 minutes for warm-up during interval history (4 minutes unbilled); ?Total  Gym (TG) Level 22 (L22) double leg partial squats 2 x 10; ?TG L22 heel raises 2 x 15; ?Forward BOSU lunges (round side up) in // bars with BUE support and LLE forward 2 x 15 ?Supine L SLR with 3# ankle weight (AW) 2 x 15; ?Hooklying clams with manual resistance from therapist 2 x 15; ?Hooklying adductor squeezes with manual resistance 2 x 15; ?Hooklying bridges 2 x 15; ?R sidelying L hip abduction with 3# AW x 15, x 10; ?R sidelying L reverse clams with 3# AW x 15, x 10; ?  ?  ?PATIENT EDUCATION: ?Education details: Pt educated throughout session about proper posture and technique with exercises. Improved exercise technique, movement at target joints, use of target muscles after min to mod verbal, visual,  tactile cues.  ?Person educated: Patient ?Education method: Explanation ?Education comprehension: verbalized understanding and returned demonstration ? ? ?HOME EXERCISE PROGRAM: ?Access Code: [email or web address] ? ? ? PT Short Term Goals  ? ?  ? PT SHORT TERM GOAL #1  ? Title Pt will be independent with HEP in order to decrease L knee pain and increase strength in order to improve pain-free function at home.   ? Time 3   ? Period Weeks   ? Status On-going   ? Target Date 05/09/21   ? ?  ?  ? ?  ? ? ? PT Long Term Goals  ? ?  ? PT LONG TERM GOAL #1  ? Title Pt will decrease 5TSTS time by at least 3s in order to demonstrate clinically significant improvement in LE strength   ? Baseline 03/28/21: 15.97s; 04/23/21: 14.6s   ? Time 6   ? Period Weeks   ? Status Partially Met   ? Target Date 05/09/21   ?  ? PT LONG TERM GOAL #2  ? Title Pt will increase 10MWT by at least 0.13 m/s in order to demonstrate clinically significant improvement in household and community ambulation to decrease risk for falls.   ? Baseline 03/28/21: 0.84m/s; 04/23/21: self-selected: 0.74 m/s   ? Time 6   ? Period Weeks   ? Status On-going   ? Target Date 05/09/21   ?  ? PT LONG TERM GOAL #3  ? Title Pt will increase L knee extension to 0 deg. in order to improve functional ability in walking and standing   ? Baseline 03/28/21: Lacking 5 deg form TKE; 04/23/21: 0 degrees   ? Time 6   ? Period Weeks   ? Status Achieved   ? Target Date 05/09/21   ?  ? PT LONG TERM GOAL #4  ? Title Pt will increase FOTO to at least 64 (target score) in order to demonstrate signifcant improvement in function related to L knee pain and buckling.   ? Baseline 03/28/21: 50; 04/23/21: 50   ? Time 6   ? Period Weeks   ? Status On-going   ? Target Date 05/09/21   ? ?  ?  ? ?  ? ? ? Plan - 04/30/21 1016   ? ? Clinical Impression Statement Repeated Total Gym strengthening in partial weightbearing to continue with closed chain exercises. Also performed BOSU lunges. Full weightbearing exercises  during last session were too painful. Additional exercises today performed in nonweightbearing or partial weightbearing positions to decrease/minimize pain. Will update outcome measures/goals/progress note during next visit as well as progress pain-free strengthening in future sessions. Pt would benefit from continued PT services in order to address deficits in ROM, gait,  and LE strength in order to improve daily function and quality of life.   ? Personal Factors and Comorbidities Fitness;Comorbidity 3+;Past/Current Experience;Time since onset of injury/illness/exacerbation   ? Comorbidities Knee OA   ? Examination-Activity Limitations Stand;Locomotion Level;Squat   ? Examination-Participation Restrictions Shop;Community Activity;Valla Leaver Work   ? Stability/Clinical Decision Making Stable/Uncomplicated   ? Rehab Potential Good   ? PT Frequency 2x / week   ? PT Duration 6 weeks   ? PT Treatment/Interventions Electrical Stimulation;Cryotherapy;Iontophoresis 12m/ml Dexamethasone;Ultrasound;Gait training;Functional mobility training;Therapeutic activities;Therapeutic exercise;Balance training;Manual techniques;Passive range of motion;Dry needling;Vestibular;Spinal Manipulations;Joint Manipulations;ADLs/Self Care Home Management;Aquatic Therapy;Moist Heat;Stair training;Neuromuscular re-education;Patient/family education   ? PT Next Visit Plan Reassess HEP, Functional strengthening, stretching hamstrings and hip flexors   ? PT [place]  ? Consulted and Agree with Plan of Care Patient   ? ?  ?  ? ?  ? ? ? ?JLyndel SafeHuprich PT, DPT, GCS  ?Korbin Notaro, PT ?04/30/2021, 11:09 AM ? ?   ?

## 2021-05-01 NOTE — Telephone Encounter (Signed)
Requested Prescriptions  ?Pending Prescriptions Disp Refills  ?? meloxicam (MOBIC) 7.5 MG tablet [Pharmacy Med Name: MELOXICAM 7.5MG TABLETS] 60 tablet 0  ?  Sig: TAKE 1 TO 2 TABLETS(7.5 TO 15 MG) BY MOUTH DAILY AS NEEDED FOR PAIN  ?  ? Analgesics:  COX2 Inhibitors Failed - 04/30/2021  7:27 AM  ?  ?  Failed - Manual Review: Labs are only required if the patient has taken medication for more than 8 weeks.  ?  ?  Failed - eGFR is 30 or above and within 360 days  ?  GFR, Est African American  ?Date Value Ref Range Status  ?05/23/2019 113 > OR = 60 mL/min/1.39m Final  ? ?GFR, Est Non African American  ?Date Value Ref Range Status  ?05/23/2019 98 > OR = 60 mL/min/1.776mFinal  ?   ?  ?  Passed - HGB in normal range and within 360 days  ?  Hemoglobin  ?Date Value Ref Range Status  ?06/19/2020 11.9 11.7 - 15.5 g/dL Final  ?   ?  ?  Passed - Cr in normal range and within 360 days  ?  Creat  ?Date Value Ref Range Status  ?06/19/2020 0.87 0.50 - 0.99 mg/dL Final  ?  Comment:  ?  For patients >4913ears of age, the reference limit ?for Creatinine is approximately 13% higher for people ?identified as African-American. ?. ?  ?   ?  ?  Passed - HCT in normal range and within 360 days  ?  HCT  ?Date Value Ref Range Status  ?06/19/2020 36.8 35.0 - 45.0 % Final  ?   ?  ?  Passed - AST in normal range and within 360 days  ?  AST  ?Date Value Ref Range Status  ?06/19/2020 11 10 - 35 U/L Final  ?   ?  ?  Passed - ALT in normal range and within 360 days  ?  ALT  ?Date Value Ref Range Status  ?06/19/2020 12 6 - 29 U/L Final  ?   ?  ?  Passed - Patient is not pregnant  ?  ?  Passed - Valid encounter within last 12 months  ?  Recent Outpatient Visits   ?      ? 1 month ago [facility]  [facility]  ? 1 month ago [facility]  [facility]  ? 2 months ago [facility] of left knee  [facility]  ? 2  months ago Chronic pain of left knee  ? [facility]  ? 10 months ago Encounter for general adult medical examination with abnormal findings  ? [facility]  ?  ?  ?Future Appointments   ?        ? In 1 month Baity, [facility]?  ? ?  ?  ?  ? ?

## 2021-05-05 ENCOUNTER — Ambulatory Visit: Payer: Medicare Other

## 2021-05-05 NOTE — Therapy (Incomplete)
?OUTPATIENT PHYSICAL THERAPY TREATMENT NOTE ? ? ?Patient Name: Darlene Mcdaniel ?MRN: [id number] ?DOB:1951/01/23, 71 y.o., female ?Today's Date: 05/05/2021 ? ?PCP: Jearld Fenton, NP ?REFERRING PROVIDER: Montel Culver, MD ? ? ? ? ?Past Medical History:  ?Diagnosis Date  ? Hyperlipidemia   ? Hypertension   ? ?No past surgical history on file. ?Patient Active Problem List  ? Diagnosis Date Noted  ? Mixed incontinence 03/10/2021  ? Class 1 obesity due to excess calories with body mass index (BMI) of 31.0 to 31.9 in adult 02/13/2021  ? Primary osteoarthritis of left knee 06/18/2020  ? Obstructive sleep apnea 07/18/2019  ? Hyperlipidemia 06/21/2019  ? Hypertension 05/23/2019  ? ? ?REFERRING DIAG: M17.12 (ICD-10-CM) - Primary osteoarthritis of left knee  ? ?THERAPY DIAG: Chronic pain of left knee ? ?Muscle weakness (generalized) ? ?PERTINENT HISTORY: Pt has history of L knee pain that started ~1 year ago and began gradually getting worse. ~6 weeks ago pain got so intense that the throbbing would wake her up from her sleep in the middle of the night. Went to go see her GP who referred her to Dr. Zigmund Daniel who gave her an injection in her knee on 02/24/21. Since the injection her knee pain has significantly decreased, but she has been experiencing some buckling in her knee. She reports no falls with the buckling. Pt reports still occasionally feeling the pain in her L knee, but no where near as bad as it had been before the injection. Prior to the injection the pt stated that she was an entirely different person due to the amount of pain she was in. Prior to the injection the pain at its worst would reach a 10/10 on the NPRS. Pt reports the pain was constant and throbbing. Pt stated that in the mornings her knee would be stiff and painful, but after she took a hot shower and started moving it would ease up, then after some activity the pain would increase again. To ease the pain the pt mainly just tried to stay off of the  leg. Pt stated that when she did have to walk she "walked like a rocking chair". Pt still can not walk for long durations, but she reports that it is more due to fatigue than pain. Pt has no previous back, hip, knee or ankle injuries that she can recall. Pt describes herself as sedentary and does no perform much physical activity. Pt enjoys reading and would like to be able to walk around [place] and the grocery store. Pt lives in a single level home with no stairs.  ? ?PRECAUTIONS: None ? ?SUBJECTIVE: Pt reports no pain or stiffness in her knee upon arrival. No excessive soreness after the last therapy session. No specific questions or concerns upon arrival ? ?PAIN:  ?Are you having pain? No ? ? ?TODAY'S TREATMENT:  ? ?   ?There Ex ?NuStep (seat 5, arms 7) L2-3 x 5 minutes for warm-up during interval history (4 minutes unbilled); ?Total Gym (TG) Level 22 (L22) double leg partial squats 2 x 10; ?TG L22 heel raises 2 x 15; ?Forward BOSU lunges (round side up) in // bars with BUE support and LLE forward 2 x 15 ?Supine L SLR with 3# ankle weight (AW) 2 x 15; ?Hooklying clams with manual resistance from therapist 2 x 15; ?Hooklying adductor squeezes with manual resistance 2 x 15; ?Hooklying bridges 2 x 15; ?R sidelying L hip abduction with 3# AW x 15, x 10; ?R sidelying L reverse  clams with 3# AW x 15, x 10; ?  ?  ?PATIENT EDUCATION: ?Education details: Pt educated throughout session about proper posture and technique with exercises. Improved exercise technique, movement at target joints, use of target muscles after min to mod verbal, visual, tactile cues.  ?Person educated: Patient ?Education method: Explanation ?Education comprehension: verbalized understanding and returned demonstration ? ? ?HOME EXERCISE PROGRAM: ?Access Code: [email or web address] ? ? ? PT Short Term Goals  ? ?  ? PT SHORT TERM GOAL #1  ? Title Pt will be independent with HEP in order to decrease L knee pain and increase strength in order to improve pain-free  function at home.   ? Time 3   ? Period Weeks   ? Status On-going   ? Target Date 05/09/21   ? ?  ?  ? ?  ? ? ? PT Long Term Goals  ? ?  ? PT LONG TERM GOAL #1  ? Title Pt will decrease 5TSTS time by at least 3s in order to demonstrate clinically significant improvement in LE strength   ? Baseline 03/28/21: 15.97s; 04/23/21: 14.6s   ? Time 6   ? Period Weeks   ? Status Partially Met   ? Target Date 05/09/21   ?  ? PT LONG TERM GOAL #2  ? Title Pt will increase 10MWT by at least 0.13 m/s in order to demonstrate clinically significant improvement in household and community ambulation to decrease risk for falls.   ? Baseline 03/28/21: 0.36ms; 04/23/21: self-selected: 0.74 m/s   ? Time 6   ? Period Weeks   ? Status On-going   ? Target Date 05/09/21   ?  ? PT LONG TERM GOAL #3  ? Title Pt will increase L knee extension to 0 deg. in order to improve functional ability in walking and standing   ? Baseline 03/28/21: Lacking 5 deg form TKE; 04/23/21: 0 degrees   ? Time 6   ? Period Weeks   ? Status Achieved   ? Target Date 05/09/21   ?  ? PT LONG TERM GOAL #4  ? Title Pt will increase FOTO to at least 64 (target score) in order to demonstrate signifcant improvement in function related to L knee pain and buckling.   ? Baseline 03/28/21: 50; 04/23/21: 50   ? Time 6   ? Period Weeks   ? Status On-going   ? Target Date 05/09/21   ? ?  ?  ? ?  ? ? ? Plan - 04/30/21 1016   ? ? Clinical Impression Statement Repeated Total Gym strengthening in partial weightbearing to continue with closed chain exercises. Also performed BOSU lunges. Full weightbearing exercises during last session were too painful. Additional exercises today performed in nonweightbearing or partial weightbearing positions to decrease/minimize pain. Will update outcome measures/goals/progress note during next visit as well as progress pain-free strengthening in future sessions. Pt would benefit from continued PT services in order to address deficits in ROM, gait, and LE  strength in order to improve daily function and quality of life.   ? Personal Factors and Comorbidities Fitness;Comorbidity 3+;Past/Current Experience;Time since onset of injury/illness/exacerbation   ? Comorbidities Knee OA   ? Examination-Activity Limitations Stand;Locomotion Level;Squat   ? Examination-Participation Restrictions Shop;Community Activity;[employer] LeaverWork   ? Stability/Clinical Decision Making Stable/Uncomplicated   ? Rehab Potential Good   ? PT Frequency 2x / week   ? PT Duration 6 weeks   ? PT Treatment/Interventions Electrical Stimulation;Cryotherapy;Iontophoresis 479mml Dexamethasone;Ultrasound;Gait training;Functional mobility training;Therapeutic  activities;Therapeutic exercise;Balance training;Manual techniques;Passive range of motion;Dry needling;Vestibular;Spinal Manipulations;Joint Manipulations;ADLs/Self Care Home Management;Aquatic Therapy;Moist Heat;Stair training;Neuromuscular re-education;Patient/family education   ? PT Next Visit Plan Reassess HEP, Functional strengthening, stretching hamstrings and hip flexors   ? PT [place]   ? Consulted and Agree with Plan of Care Patient   ? ?  ?  ? ?  ? ? ? ?Lyndel Safe Daziya Redmond PT, DPT, GCS  ?Germaine Ripp, PT ?05/05/2021, 9:51 AM ? ?   ?

## 2021-05-09 NOTE — Therapy (Signed)
?OUTPATIENT PHYSICAL THERAPY TREATMENT NOTE/PROGRESS NOTE/RECERTIFICATION ? ? ?Dates of reporting period  03/28/21   to   05/12/21  ? ? ? ?Patient Name: Darlene Mcdaniel ?MRN: [id number] ?DOB:10/15/1950, 71 y.o., female ?Today's Date: 05/12/2021 ? ?PCP: Jearld Fenton, NP ?REFERRING PROVIDER: Montel Culver, MD ? ? PT End of Session - 05/12/21 1008   ? ? Visit Number 10   ? Number of Visits 19   ? Date for PT Re-Evaluation 06/23/21   ? Authorization Type eval: 7/74/12, recertification: 8/78/67   ? PT Start Time 1015   ? PT Stop Time 1100   ? PT Time Calculation (min) 45 min   ? Activity Tolerance Patient tolerated treatment well   ? Behavior During Therapy [facility] for tasks assessed/performed   ? ?  ?  ? ?  ? ? ? ?Past Medical History:  ?Diagnosis Date  ? Hyperlipidemia   ? Hypertension   ? ?History reviewed. No pertinent surgical history. ?Patient Active Problem List  ? Diagnosis Date Noted  ? Mixed incontinence 03/10/2021  ? Class 1 obesity due to excess calories with body mass index (BMI) of 31.0 to 31.9 in adult 02/13/2021  ? Primary osteoarthritis of left knee 06/18/2020  ? Obstructive sleep apnea 07/18/2019  ? Hyperlipidemia 06/21/2019  ? Hypertension 05/23/2019  ? ? ?REFERRING DIAG: M17.12 (ICD-10-CM) - Primary osteoarthritis of left knee  ? ?THERAPY DIAG: Chronic pain of left knee ? ?Muscle weakness (generalized) ? ?PERTINENT HISTORY: Pt has history of L knee pain that started ~1 year ago and began gradually getting worse. ~6 weeks ago pain got so intense that the throbbing would wake her up from her sleep in the middle of the night. Went to go see her GP who referred her to Dr. Zigmund Daniel who gave her an injection in her knee on 02/24/21. Since the injection her knee pain has significantly decreased, but she has been experiencing some buckling in her knee. She reports no falls with the buckling. Pt reports still occasionally feeling the pain in her L knee, but no where near as bad as it had been before the  injection. Prior to the injection the pt stated that she was an entirely different person due to the amount of pain she was in. Prior to the injection the pain at its worst would reach a 10/10 on the NPRS. Pt reports the pain was constant and throbbing. Pt stated that in the mornings her knee would be stiff and painful, but after she took a hot shower and started moving it would ease up, then after some activity the pain would increase again. To ease the pain the pt mainly just tried to stay off of the leg. Pt stated that when she did have to walk she "walked like a rocking chair". Pt still can not walk for long durations, but she reports that it is more due to fatigue than pain. Pt has no previous back, hip, knee or ankle injuries that she can recall. Pt describes herself as sedentary and does no perform much physical activity. Pt enjoys reading and would like to be able to walk around [place] and the grocery store. Pt lives in a single level home with no stairs.  ? ?PRECAUTIONS: None ? ?SUBJECTIVE: Pt reports no pain or stiffness in her knee upon arrival. She has noticed some increase in her L knee buckling lately. No specific questions upon arrival.  ? ?PAIN:  ?Are you having pain? No ? ? ?TODAY'S TREATMENT:  ? ?   ?  There Ex ?NuStep (seat 6, arms 7) L2-3 x 5 minutes for warm-up during interval history (4 minutes unbilled); ?Total Gym (TG) Level 22 (L22) double leg partial squats 2 x 15; ?TG L22 heel raises 2 x 20; ?Forward BOSU lunges (round side up) in // bars with BUE support and LLE forward x 10, second set not performed secondary to pain; ?Side stepping in // bars with green tband above knees x 4 lengths, x 6 lengths; ?Seated LAQ with 4# ankle weights (AW) x 20 BLE; ?Seated marches with 4# AW x 20 BLE; ?Seated clams with green tband x 20; ?Seated adductor ball squeeze x 20; ?Supine L SLR with 3# ankle weight (AW) x 15, x 18; ?Hooklying bridges 2 x 10; ?R sidelying L hip abduction with 3# AW x 15; ?R sidelying  L reverse clams with 3# AW x 15; ?  ?  ?PATIENT EDUCATION: ?Education details: Pt educated throughout session about proper posture and technique with exercises. Improved exercise technique, movement at target joints, use of target muscles after min to mod verbal, visual, tactile cues. Recertification ?Person educated: Patient ?Education method: Explanation ?Education comprehension: verbalized understanding and returned demonstration ? ? ?HOME EXERCISE PROGRAM: ?Access Code: [email or web address] ? ? ? PT Short Term Goals  ? ?  ? PT SHORT TERM GOAL #1  ? Title Pt will be independent with HEP in order to decrease L knee pain and increase strength in order to improve pain-free function at home.   ? Time 3   ? Period Weeks   ? Status Achieved  ? Target Date   ? ?  ?  ? ?  ? ? ? PT Long Term Goals  ? ?  ? PT LONG TERM GOAL #1  ? Title Pt will decrease 5TSTS time by at least 3s in order to demonstrate clinically significant improvement in LE strength   ? Baseline 03/28/21: 15.97s; 04/23/21: 14.6s   ? Time 6   ? Period Weeks   ? Status Partially Met   ? Target Date 06/23/2021   ?  ? PT LONG TERM GOAL #2  ? Title Pt will increase 10MWT by at least 0.13 m/s in order to demonstrate clinically significant improvement in household and community ambulation to decrease risk for falls.   ? Baseline 03/28/21: 0.3m/s; 04/23/21: self-selected: 0.74 m/s   ? Time 6   ? Period Weeks   ? Status On-going   ? Target Date 06/23/2021   ?  ? PT LONG TERM GOAL #3  ? Title Pt will increase L knee extension to 0 deg. in order to improve functional ability in walking and standing   ? Baseline 03/28/21: Lacking 5 deg form TKE; 04/23/21: 0 degrees   ? Time 6   ? Period Weeks   ? Status Achieved   ? Target Date   ?  ? PT LONG TERM GOAL #4  ? Title Pt will increase FOTO to at least 64 (target score) in order to demonstrate signifcant improvement in function related to L knee pain and buckling.   ? Baseline 03/28/21: 50; 04/23/21: 50   ? Time 6   ? Period Weeks   ? Status  On-going   ? Target Date 06/23/2021   ? ?  ?  ? ?  ? ? ? Plan - 04/30/21 1016   ? ? Clinical Impression Statement Updated outcome measures with patient during 04/23/20 visit so no need to update during session today.  Her FOTO score was unchanged at  50 and her 6m gait speed also remained relatively unchanged at 0.74 m/seconds.  She is now able to achieve 0 degrees of extension in her left knee whereas at the initial evaluation she was lacking 5 degrees.  Her 5 Times Sit to Stand Test decreased from 16.0 seconds to 14.6 seconds. Her L knee pain has continued to improve compared to when she first started therapy. Continued partial weightbearing strengthening including seated, supine, and sidelying positions. Additional exercises included during session today in the parallel bars. Repeated Total Gym heel raises and squats today for partial weightbearing closed chain strengthening. Will continue to progress pain-free strengthening in future sessions. Plan is to decrease frequency to once/week per patient request for the next six weeks as we progress toward independent home program and discharge. Pt would benefit from continued PT services in order to address deficits in ROM, gait, and LE strength in order to improve daily function and quality of life.   ? Personal Factors and Comorbidities Fitness;Comorbidity 3+;Past/Current Experience;Time since onset of injury/illness/exacerbation   ? Comorbidities Knee OA   ? Examination-Activity Limitations Stand;Locomotion Level;Squat   ? Examination-Participation Restrictions Shop;Community Activity;V[employer] Leaver Work   ? Stability/Clinical Decision Making Stable/Uncomplicated   ? Rehab Potential Good   ? PT Frequency 1x / week   ? PT Duration 6 weeks   ? PT Treatment/Interventions Electrical Stimulation;Cryotherapy;Iontophoresis 4mg /ml Dexamethasone;Ultrasound;Gait training;Functional mobility training;Therapeutic activities;Therapeutic exercise;Balance training;Manual techniques;Passive  range of motion;Dry needling;Vestibular;Spinal Manipulations;Joint Manipulations;ADLs/Self Care Home Management;Aquatic Therapy;Moist Heat;Stair training;Neuromuscular re-education;Patient/family education   ?

## 2021-05-12 ENCOUNTER — Ambulatory Visit: Payer: Medicare Other | Attending: Family Medicine

## 2021-05-12 DIAGNOSIS — G8929 Other chronic pain: Secondary | ICD-10-CM | POA: Insufficient documentation

## 2021-05-12 DIAGNOSIS — M6281 Muscle weakness (generalized): Secondary | ICD-10-CM | POA: Insufficient documentation

## 2021-05-12 DIAGNOSIS — R2689 Other abnormalities of gait and mobility: Secondary | ICD-10-CM | POA: Insufficient documentation

## 2021-05-12 DIAGNOSIS — M25562 Pain in left knee: Secondary | ICD-10-CM | POA: Insufficient documentation

## 2021-05-21 ENCOUNTER — Ambulatory Visit: Payer: Medicare Other

## 2021-05-21 DIAGNOSIS — M6281 Muscle weakness (generalized): Secondary | ICD-10-CM | POA: Diagnosis not present

## 2021-05-21 DIAGNOSIS — R2689 Other abnormalities of gait and mobility: Secondary | ICD-10-CM | POA: Diagnosis not present

## 2021-05-21 DIAGNOSIS — G8929 Other chronic pain: Secondary | ICD-10-CM

## 2021-05-21 DIAGNOSIS — M25562 Pain in left knee: Secondary | ICD-10-CM | POA: Diagnosis not present

## 2021-05-21 NOTE — Therapy (Signed)
?OUTPATIENT PHYSICAL THERAPY TREATMENT NOTE ? ? ?Patient Name: Darlene Mcdaniel ?MRN: [id number] ?DOB:08/23/1950, 71 y.o., female ?Today's Date: 05/21/2021 ? ?PCP: Jearld Fenton, NP ?REFERRING PROVIDER: Montel Culver, MD ? ? PT End of Session - 05/21/21 1014   ? ? Visit Number 11   ? Number of Visits 19   ? Date for PT Re-Evaluation 06/23/21   ? Authorization Type eval: 5/46/50, recertification: 3/54/65   ? PT Start Time 1015   ? PT Stop Time 1100   ? PT Time Calculation (min) 45 min   ? Activity Tolerance Patient tolerated treatment well   ? Behavior During Therapy [facility] for tasks assessed/performed   ? ?  ?  ? ?  ? ? ? ?Past Medical History:  ?Diagnosis Date  ? Hyperlipidemia   ? Hypertension   ? ?History reviewed. No pertinent surgical history. ?Patient Active Problem List  ? Diagnosis Date Noted  ? Mixed incontinence 03/10/2021  ? Class 1 obesity due to excess calories with body mass index (BMI) of 31.0 to 31.9 in adult 02/13/2021  ? Primary osteoarthritis of left knee 06/18/2020  ? Obstructive sleep apnea 07/18/2019  ? Hyperlipidemia 06/21/2019  ? Hypertension 05/23/2019  ? ? ?REFERRING DIAG: M17.12 (ICD-10-CM) - Primary osteoarthritis of left knee  ? ?THERAPY DIAG: Chronic pain of left knee ? ?Muscle weakness (generalized) ? ?Other abnormalities of gait and mobility ? ?PERTINENT HISTORY: Pt has history of L knee pain that started ~1 year ago and began gradually getting worse. ~6 weeks ago pain got so intense that the throbbing would wake her up from her sleep in the middle of the night. Went to go see her GP who referred her to Dr. Zigmund Daniel who gave her an injection in her knee on 02/24/21. Since the injection her knee pain has significantly decreased, but she has been experiencing some buckling in her knee. She reports no falls with the buckling. Pt reports still occasionally feeling the pain in her L knee, but no where near as bad as it had been before the injection. Prior to the injection the pt stated that  she was an entirely different person due to the amount of pain she was in. Prior to the injection the pain at its worst would reach a 10/10 on the NPRS. Pt reports the pain was constant and throbbing. Pt stated that in the mornings her knee would be stiff and painful, but after she took a hot shower and started moving it would ease up, then after some activity the pain would increase again. To ease the pain the pt mainly just tried to stay off of the leg. Pt stated that when she did have to walk she "walked like a rocking chair". Pt still can not walk for long durations, but she reports that it is more due to fatigue than pain. Pt has no previous back, hip, knee or ankle injuries that she can recall. Pt describes herself as sedentary and does no perform much physical activity. Pt enjoys reading and would like to be able to walk around [place] and the grocery store. Pt lives in a single level home with no stairs.  ? ?PRECAUTIONS: None ? ?SUBJECTIVE: Pt reports no pain or stiffness in her knee upon arrival. She has been doing well since her last therapy session without any significant updates. No specific questions upon arrival.  ? ?PAIN:  ?Are you having pain? No ? ? ?TODAY'S TREATMENT:  ? ?   ?There Ex ?NuStep (seat 6,  arms 7) L2-3 x 5 minutes for warm-up during interval history (4 minutes unbilled); ?Total Gym (TG) Level 22 (L22) double leg partial squats x 15, x 10; ?TG L22 heel raises 2 x 20; ?Forward BOSU lunges (round side up) in // bars with BUE support and LLE forward 2 x 10; ?Left lateral BOSU lunges (round side up) in // bars with BUE support x 10, second set not performed secondary to pain; ?Side stepping in // bars with green tband above knees 2 x 4 lengths; ?Seated LLE LAQ with manual resistance 2 x 10; ?Seated LLE hamstring with manual resistance 2 x 10; ?Seated clams with green tband 2 x 10; ?Seated adductor ball squeeze 2 x 10; ? ?  ?  ?PATIENT EDUCATION: ?Education details: Pt educated throughout  session about proper posture and technique with exercises. Improved exercise technique, movement at target joints, use of target muscles after min to mod verbal, visual, tactile cues. Recertification ?Person educated: Patient ?Education method: Explanation ?Education comprehension: verbalized understanding and returned demonstration ? ? ?HOME EXERCISE PROGRAM: ?Access Code: [email or web address] ? ? ? PT Short Term Goals  ? ?  ? PT SHORT TERM GOAL #1  ? Title Pt will be independent with HEP in order to decrease L knee pain and increase strength in order to improve pain-free function at home.   ? Time 3   ? Period Weeks   ? Status Achieved  ? Target Date   ? ?  ?  ? ?  ? ? ? PT Long Term Goals  ? ?  ? PT LONG TERM GOAL #1  ? Title Pt will decrease 5TSTS time by at least 3s in order to demonstrate clinically significant improvement in LE strength   ? Baseline 03/28/21: 15.97s; 04/23/21: 14.6s   ? Time 6   ? Period Weeks   ? Status Partially Met   ? Target Date 06/23/2021   ?  ? PT LONG TERM GOAL #2  ? Title Pt will increase 10MWT by at least 0.13 m/s in order to demonstrate clinically significant improvement in household and community ambulation to decrease risk for falls.   ? Baseline 03/28/21: 0.48m/s; 04/23/21: self-selected: 0.74 m/s   ? Time 6   ? Period Weeks   ? Status On-going   ? Target Date 06/23/2021   ?  ? PT LONG TERM GOAL #3  ? Title Pt will increase L knee extension to 0 deg. in order to improve functional ability in walking and standing   ? Baseline 03/28/21: Lacking 5 deg form TKE; 04/23/21: 0 degrees   ? Time 6   ? Period Weeks   ? Status Achieved   ? Target Date   ?  ? PT LONG TERM GOAL #4  ? Title Pt will increase FOTO to at least 64 (target score) in order to demonstrate signifcant improvement in function related to L knee pain and buckling.   ? Baseline 03/28/21: 50; 04/23/21: 50   ? Time 6   ? Period Weeks   ? Status On-going   ? Target Date 06/23/2021   ? ?  ?  ? ?  ? ? ? Plan - 04/30/21 1016   ? ? Clinical Impression  Statement Continued partial weightbearing strengthening including seated, supine, and sidelying positions. Additional exercises included during session today again today in the parallel bars. Repeated Total Gym heel raises and squats today for partial weightbearing closed chain strengthening. Will continue to progress pain-free strengthening in future sessions. Will continue  to progress toward independent home program and discharge over the coming weeks. Pt would benefit from continued PT services in order to address deficits in ROM, gait, and LE strength in order to improve daily function and quality of life.   ? Personal Factors and Comorbidities Fitness;Comorbidity 3+;Past/Current Experience;Time since onset of injury/illness/exacerbation   ? Comorbidities Knee OA   ? Examination-Activity Limitations Stand;Locomotion Level;Squat   ? Examination-Participation Restrictions Shop;Community Activity;[employer] Leaver Work   ? Stability/Clinical Decision Making Stable/Uncomplicated   ? Rehab Potential Good   ? PT Frequency 1x / week   ? PT Duration 6 weeks   ? PT Treatment/Interventions Electrical Stimulation;Cryotherapy;Iontophoresis 4mg /ml Dexamethasone;Ultrasound;Gait training;Functional mobility training;Therapeutic activities;Therapeutic exercise;Balance training;Manual techniques;Passive range of motion;Dry needling;Vestibular;Spinal Manipulations;Joint Manipulations;ADLs/Self Care Home Management;Aquatic Therapy;Moist Heat;Stair training;Neuromuscular re-education;Patient/family education   ? PT Next Visit Plan Reassess HEP, Functional strengthening, stretching hamstrings and hip flexors   ? PT [place]   ? Consulted and Agree with Plan of Care Patient   ? ?  ?  ? ?  ? ? ? ?Lyndel Safe Glenda Kunst PT, DPT, GCS ?Darlene Mcdaniel, PT ?05/21/2021, 12:05 PM ? ?   ?

## 2021-05-26 ENCOUNTER — Ambulatory Visit: Payer: Medicare Other

## 2021-05-26 NOTE — Therapy (Incomplete)
?OUTPATIENT PHYSICAL THERAPY TREATMENT NOTE ? ? ?Patient Name: Darlene Mcdaniel ?MRN: [id number] ?DOB:12/05/1950, 71 y.o., female ?Today's Date: 05/26/2021 ? ?PCP: Baity, Regina W, NP ?REFERRING PROVIDER: Matthews, Jason J, MD ? ? ? ? ? ?Past Medical History:  ?Diagnosis Date  ? Hyperlipidemia   ? Hypertension   ? ?No past surgical history on file. ?Patient Active Problem List  ? Diagnosis Date Noted  ? Mixed incontinence 03/10/2021  ? Class 1 obesity due to excess calories with body mass index (BMI) of 31.0 to 31.9 in adult 02/13/2021  ? Primary osteoarthritis of left knee 06/18/2020  ? Obstructive sleep apnea 07/18/2019  ? Hyperlipidemia 06/21/2019  ? Hypertension 05/23/2019  ? ? ?REFERRING DIAG: M17.12 (ICD-10-CM) - Primary osteoarthritis of left knee  ? ?THERAPY DIAG: Chronic pain of left knee ? ?Muscle weakness (generalized) ? ?PERTINENT HISTORY: Pt has history of L knee pain that started ~1 year ago and began gradually getting worse. ~6 weeks ago pain got so intense that the throbbing would wake her up from her sleep in the middle of the night. Went to go see her GP who referred her to Dr. Matthews who gave her an injection in her knee on 02/24/21. Since the injection her knee pain has significantly decreased, but she has been experiencing some buckling in her knee. She reports no falls with the buckling. Pt reports still occasionally feeling the pain in her L knee, but no where near as bad as it had been before the injection. Prior to the injection the pt stated that she was an entirely different person due to the amount of pain she was in. Prior to the injection the pain at its worst would reach a 10/10 on the NPRS. Pt reports the pain was constant and throbbing. Pt stated that in the mornings her knee would be stiff and painful, but after she took a hot shower and started moving it would ease up, then after some activity the pain would increase again. To ease the pain the pt mainly just tried to stay off of the  leg. Pt stated that when she did have to walk she "walked like a rocking chair". Pt still can not walk for long durations, but she reports that it is more due to fatigue than pain. Pt has no previous back, hip, knee or ankle injuries that she can recall. Pt describes herself as sedentary and does no perform much physical activity. Pt enjoys reading and would like to be able to walk around [place] and the grocery store. Pt lives in a single level home with no stairs.  ? ?PRECAUTIONS: None ? ?SUBJECTIVE: Pt reports no pain or stiffness in her knee upon arrival. She has been doing well since her last therapy session without any significant updates. No specific questions upon arrival.  ? ?PAIN:  ?Are you having pain? No ? ? ?TODAY'S TREATMENT:  ? ?   ?There Ex ?NuStep (seat 6, arms 7) L2-3 x 5 minutes for warm-up during interval history (4 minutes unbilled); ?Total Gym (TG) Level 22 (L22) double leg partial squats x 15, x 10; ?TG L22 heel raises 2 x 20; ?Forward BOSU lunges (round side up) in // bars with BUE support and LLE forward 2 x 10; ?Left lateral BOSU lunges (round side up) in // bars with BUE support x 10, second set not performed secondary to pain; ?Side stepping in // bars with green tband above knees 2 x 4 lengths; ?Seated LLE LAQ with manual resistance 2 x   10; ?Seated LLE hamstring with manual resistance 2 x 10; ?Seated clams with green tband 2 x 10; ?Seated adductor ball squeeze 2 x 10; ? ?  ?  ?PATIENT EDUCATION: ?Education details: Pt educated throughout session about proper posture and technique with exercises. Improved exercise technique, movement at target joints, use of target muscles after min to mod verbal, visual, tactile cues. Recertification ?Person educated: Patient ?Education method: Explanation ?Education comprehension: verbalized understanding and returned demonstration ? ? ?HOME EXERCISE PROGRAM: ?Access Code: [email or web address] ? ? ? PT Short Term Goals  ? ?  ? PT SHORT TERM GOAL #1  ? Title Pt will  be independent with HEP in order to decrease L knee pain and increase strength in order to improve pain-free function at home.   ? Time 3   ? Period Weeks   ? Status Achieved  ? Target Date   ? ?  ?  ? ?  ? ? ? PT Long Term Goals  ? ?  ? PT LONG TERM GOAL #1  ? Title Pt will decrease 5TSTS time by at least 3s in order to demonstrate clinically significant improvement in LE strength   ? Baseline 03/28/21: 15.97s; 04/23/21: 14.6s   ? Time 6   ? Period Weeks   ? Status Partially Met   ? Target Date 06/23/2021   ?  ? PT LONG TERM GOAL #2  ? Title Pt will increase 10MWT by at least 0.13 m/s in order to demonstrate clinically significant improvement in household and community ambulation to decrease risk for falls.   ? Baseline 03/28/21: 0.46ms; 04/23/21: self-selected: 0.74 m/s   ? Time 6   ? Period Weeks   ? Status On-going   ? Target Date 06/23/2021   ?  ? PT LONG TERM GOAL #3  ? Title Pt will increase L knee extension to 0 deg. in order to improve functional ability in walking and standing   ? Baseline 03/28/21: Lacking 5 deg form TKE; 04/23/21: 0 degrees   ? Time 6   ? Period Weeks   ? Status Achieved   ? Target Date   ?  ? PT LONG TERM GOAL #4  ? Title Pt will increase FOTO to at least 64 (target score) in order to demonstrate signifcant improvement in function related to L knee pain and buckling.   ? Baseline 03/28/21: 50; 04/23/21: 50   ? Time 6   ? Period Weeks   ? Status On-going   ? Target Date 06/23/2021   ? ?  ?  ? ?  ? ? ? Plan - 04/30/21 1016   ? ? Clinical Impression Statement Continued partial weightbearing strengthening including seated, supine, and sidelying positions. Additional exercises included during session today again today in the parallel bars. Repeated Total Gym heel raises and squats today for partial weightbearing closed chain strengthening. Will continue to progress pain-free strengthening in future sessions. Will continue to progress toward independent home program and discharge over the coming weeks.  Pt would benefit from continued PT services in order to address deficits in ROM, gait, and LE strength in order to improve daily function and quality of life.   ? Personal Factors and Comorbidities Fitness;Comorbidity 3+;Past/Current Experience;Time since onset of injury/illness/exacerbation   ? Comorbidities Knee OA   ? Examination-Activity Limitations Stand;Locomotion Level;Squat   ? Examination-Participation Restrictions Shop;Community Activity;YValla LeaverWork   ? Stability/Clinical Decision Making Stable/Uncomplicated   ? Rehab Potential Good   ? PT Frequency 1x / week   ?  PT Duration 6 weeks   ? PT Treatment/Interventions Electrical Stimulation;Cryotherapy;Iontophoresis 96m/ml Dexamethasone;Ultrasound;Gait training;Functional mobility training;Therapeutic activities;Therapeutic exercise;Balance training;Manual techniques;Passive range of motion;Dry needling;Vestibular;Spinal Manipulations;Joint Manipulations;ADLs/Self Care Home Management;Aquatic Therapy;Moist Heat;Stair training;Neuromuscular re-education;Patient/family education   ? PT Next Visit Plan Reassess HEP, Functional strengthening, stretching hamstrings and hip flexors   ? PT [place]  ? Consulted and Agree with Plan of Care Patient   ? ?  ?  ? ?  ? ? ? ?JLyndel SafeHuprich PT, DPT, GCS ?Slayde Brault, PT ?05/26/2021, 11:04 AM ? ?   ?

## 2021-05-29 ENCOUNTER — Ambulatory Visit: Payer: Medicare Other

## 2021-05-29 DIAGNOSIS — R2689 Other abnormalities of gait and mobility: Secondary | ICD-10-CM | POA: Diagnosis not present

## 2021-05-29 DIAGNOSIS — M6281 Muscle weakness (generalized): Secondary | ICD-10-CM | POA: Diagnosis not present

## 2021-05-29 DIAGNOSIS — G8929 Other chronic pain: Secondary | ICD-10-CM | POA: Diagnosis not present

## 2021-05-29 DIAGNOSIS — M25562 Pain in left knee: Secondary | ICD-10-CM | POA: Diagnosis not present

## 2021-05-29 NOTE — Therapy (Signed)
?OUTPATIENT PHYSICAL THERAPY TREATMENT NOTE ? ? ?Patient Name: Darlene Mcdaniel ?MRN: [id number] ?DOB:1950-12-20, 71 y.o., female ?Today's Date: 05/29/2021 ? ?PCP: Jearld Fenton, NP ?REFERRING PROVIDER: Montel Culver, MD ? ? PT End of Session - 05/29/21 1410   ? ? Visit Number 12   ? Number of Visits 19   ? Date for PT Re-Evaluation 06/23/21   ? Authorization Type eval: 02/21/39, recertification: 7/40/81   ? PT Start Time 4481   ? PT Stop Time 8563   ? PT Time Calculation (min) 42 min   ? Activity Tolerance Patient tolerated treatment well   ? Behavior During Therapy [facility] for tasks assessed/performed   ? ?  ?  ? ?  ? ? ? ? ?Past Medical History:  ?Diagnosis Date  ? Hyperlipidemia   ? Hypertension   ? ?History reviewed. No pertinent surgical history. ?Patient Active Problem List  ? Diagnosis Date Noted  ? Mixed incontinence 03/10/2021  ? Class 1 obesity due to excess calories with body mass index (BMI) of 31.0 to 31.9 in adult 02/13/2021  ? Primary osteoarthritis of left knee 06/18/2020  ? Obstructive sleep apnea 07/18/2019  ? Hyperlipidemia 06/21/2019  ? Hypertension 05/23/2019  ? ? ?REFERRING DIAG: M17.12 (ICD-10-CM) - Primary osteoarthritis of left knee  ? ?THERAPY DIAG: Chronic pain of left knee ? ?Muscle weakness (generalized) ? ?PERTINENT HISTORY: Pt has history of L knee pain that started ~1 year ago and began gradually getting worse. ~6 weeks ago pain got so intense that the throbbing would wake her up from her sleep in the middle of the night. Went to go see her GP who referred her to Dr. Zigmund Daniel who gave her an injection in her knee on 02/24/21. Since the injection her knee pain has significantly decreased, but she has been experiencing some buckling in her knee. She reports no falls with the buckling. Pt reports still occasionally feeling the pain in her L knee, but no where near as bad as it had been before the injection. Prior to the injection the pt stated that she was an entirely different person due  to the amount of pain she was in. Prior to the injection the pain at its worst would reach a 10/10 on the NPRS. Pt reports the pain was constant and throbbing. Pt stated that in the mornings her knee would be stiff and painful, but after she took a hot shower and started moving it would ease up, then after some activity the pain would increase again. To ease the pain the pt mainly just tried to stay off of the leg. Pt stated that when she did have to walk she "walked like a rocking chair". Pt still can not walk for long durations, but she reports that it is more due to fatigue than pain. Pt has no previous back, hip, knee or ankle injuries that she can recall. Pt describes herself as sedentary and does no perform much physical activity. Pt enjoys reading and would like to be able to walk around [place] and the grocery store. Pt lives in a single level home with no stairs.  ? ?PRECAUTIONS: None ? ?SUBJECTIVE: Pt reports no pain or stiffness in her knee upon arrival. No significant updates since her last therapy session. No specific questions upon arrival.  ? ?PAIN:  ?Are you having pain? No ? ? ?TODAY'S TREATMENT:  ? ?   ?There Ex ?NuStep (seat 6, arms 7) L2-4 x 5 minutes for warm-up during interval history (4  minutes unbilled); ?Supine L SLR with 4# ankle weight (AW) 2 x 15, ?Hooklying bridges x 10, added bridge march x 10 BLE; ?R sidelying L hip abduction with 4# AW 2 x 15; ?R sidelying L reverse clams with 4# AW 2 x 15; ?Supine L SAQ with manual resistance from therapist 2 x 15; ?Total Gym (TG) Level 22 (L22) double leg partial squats 2 x 15, x 10; ?TG L22 heel raises 2 x 20; ?Standing L knee TKR with green tband resistance 2 x 15; ? ? ?Not performed today: ?Forward BOSU lunges (round side up) in // bars with BUE support and LLE forward 2 x 10; ?Left lateral BOSU lunges (round side up) in // bars with BUE support x 10, second set not performed secondary to pain; ?Side stepping in // bars with green tband above  knees 2 x 4 lengths; ?Seated LLE LAQ with manual resistance 2 x 10; ?Seated LLE hamstring with manual resistance 2 x 10; ?Seated clams with green tband 2 x 10; ?Seated adductor ball squeeze 2 x 10; ? ?  ?  ?PATIENT EDUCATION: ?Education details: Pt educated throughout session about proper posture and technique with exercises. Improved exercise technique, movement at target joints, use of target muscles after min to mod verbal, visual, tactile cues. ?Person educated: Patient ?Education method: Explanation ?Education comprehension: verbalized understanding and returned demonstration ? ? ?HOME EXERCISE PROGRAM: ?Access Code: [email or web address] ? ? ? PT Short Term Goals  ? ?  ? PT SHORT TERM GOAL #1  ? Title Pt will be independent with HEP in order to decrease L knee pain and increase strength in order to improve pain-free function at home.   ? Time 3   ? Period Weeks   ? Status Achieved  ? Target Date   ? ?  ?  ? ?  ? ? ? PT Long Term Goals  ? ?  ? PT LONG TERM GOAL #1  ? Title Pt will decrease 5TSTS time by at least 3s in order to demonstrate clinically significant improvement in LE strength   ? Baseline 03/28/21: 15.97s; 04/23/21: 14.6s   ? Time 6   ? Period Weeks   ? Status Partially Met   ? Target Date 06/23/2021   ?  ? PT LONG TERM GOAL #2  ? Title Pt will increase 10MWT by at least 0.13 m/s in order to demonstrate clinically significant improvement in household and community ambulation to decrease risk for falls.   ? Baseline 03/28/21: 0.80m/s; 04/23/21: self-selected: 0.74 m/s   ? Time 6   ? Period Weeks   ? Status On-going   ? Target Date 06/23/2021   ?  ? PT LONG TERM GOAL #3  ? Title Pt will increase L knee extension to 0 deg. in order to improve functional ability in walking and standing   ? Baseline 03/28/21: Lacking 5 deg form TKE; 04/23/21: 0 degrees   ? Time 6   ? Period Weeks   ? Status Achieved   ? Target Date   ?  ? PT LONG TERM GOAL #4  ? Title Pt will increase FOTO to at least 64 (target score) in order to  demonstrate signifcant improvement in function related to L knee pain and buckling.   ? Baseline 03/28/21: 50; 04/23/21: 50   ? Time 6   ? Period Weeks   ? Status On-going   ? Target Date 06/23/2021   ? ?  ?  ? ?  ? ? ?  Plan - 04/30/21 1016   ? ? Clinical Impression Statement Continued strengthening during session today and progressed partial weightbearing exercises. Will continue to progress pain-free strengthening in future sessions. Will continue to progress toward independent home program and discharge over the coming weeks. Pt would benefit from continued PT services in order to address deficits in ROM, gait, and LE strength in order to improve daily function and quality of life.   ? Personal Factors and Comorbidities Fitness;Comorbidity 3+;Past/Current Experience;Time since onset of injury/illness/exacerbation   ? Comorbidities Knee OA   ? Examination-Activity Limitations Stand;Locomotion Level;Squat   ? Examination-Participation Restrictions Shop;Community Activity;Valla Leaver Work   ? Stability/Clinical Decision Making Stable/Uncomplicated   ? Rehab Potential Good   ? PT Frequency 1x / week   ? PT Duration 6 weeks   ? PT Treatment/Interventions Electrical Stimulation;Cryotherapy;Iontophoresis 4mg /ml Dexamethasone;Ultrasound;Gait training;Functional mobility training;Therapeutic activities;Therapeutic exercise;Balance training;Manual techniques;Passive range of motion;Dry needling;Vestibular;Spinal Manipulations;Joint Manipulations;ADLs/Self Care Home Management;Aquatic Therapy;Moist Heat;Stair training;Neuromuscular re-education;Patient/family education   ? PT Next Visit Plan Reassess HEP, Functional strengthening, stretching hamstrings and hip flexors   ? PT [place]   ? Consulted and Agree with Plan of Care Patient   ? ?  ?  ? ?  ? ? ? ?Lyndel Safe Blayke Cordrey PT, DPT, GCS ?Khyla Mccumbers, PT ?05/29/2021, 4:15 PM ? ?   ?

## 2021-06-02 ENCOUNTER — Ambulatory Visit: Payer: Medicare Other | Attending: Family Medicine

## 2021-06-02 DIAGNOSIS — M6281 Muscle weakness (generalized): Secondary | ICD-10-CM

## 2021-06-02 DIAGNOSIS — M25562 Pain in left knee: Secondary | ICD-10-CM | POA: Insufficient documentation

## 2021-06-02 DIAGNOSIS — G8929 Other chronic pain: Secondary | ICD-10-CM | POA: Diagnosis not present

## 2021-06-02 DIAGNOSIS — R2689 Other abnormalities of gait and mobility: Secondary | ICD-10-CM | POA: Diagnosis not present

## 2021-06-02 NOTE — Therapy (Signed)
?OUTPATIENT PHYSICAL THERAPY TREATMENT NOTE ? ? ?Patient Name: Darlene Mcdaniel ?MRN: [id number] ?DOB:[birth year], 71 y.o., female ?Today's Date: 06/02/2021 ? ?PCP: Jearld Fenton, NP ?REFERRING PROVIDER: Montel Culver, MD ? ? PT End of Session - 06/02/21 1013   ? ? Visit Number 13   ? Number of Visits 19   ? Date for PT Re-Evaluation 06/23/21   ? Authorization Type eval: 2/48/25, recertification: 0/03/70   ? PT Start Time 1015   ? PT Stop Time 1100   ? PT Time Calculation (min) 45 min   ? Activity Tolerance Patient tolerated treatment well   ? Behavior During Therapy [facility] for tasks assessed/performed   ? ?  ?  ? ?  ? ? ? ? ?Past Medical History:  ?Diagnosis Date  ? Hyperlipidemia   ? Hypertension   ? ?History reviewed. No pertinent surgical history. ?Patient Active Problem List  ? Diagnosis Date Noted  ? Mixed incontinence 03/10/2021  ? Class 1 obesity due to excess calories with body mass index (BMI) of 31.0 to 31.9 in adult 02/13/2021  ? Primary osteoarthritis of left knee 06/18/2020  ? Obstructive sleep apnea 07/18/2019  ? Hyperlipidemia 06/21/2019  ? Hypertension 05/23/2019  ? ? ?REFERRING DIAG: M17.12 (ICD-10-CM) - Primary osteoarthritis of left knee  ? ?THERAPY DIAG: Chronic pain of left knee ? ?Muscle weakness (generalized) ? ?PERTINENT HISTORY: Pt has history of L knee pain that started ~1 year ago and began gradually getting worse. ~6 weeks ago pain got so intense that the throbbing would wake her up from her sleep in the middle of the night. Went to go see her GP who referred her to Dr. Zigmund Daniel who gave her an injection in her knee on 02/24/21. Since the injection her knee pain has significantly decreased, but she has been experiencing some buckling in her knee. She reports no falls with the buckling. Pt reports still occasionally feeling the pain in her L knee, but no where near as bad as it had been before the injection. Prior to the injection the pt stated that she was an entirely different person due to  the amount of pain she was in. Prior to the injection the pain at its worst would reach a 10/10 on the NPRS. Pt reports the pain was constant and throbbing. Pt stated that in the mornings her knee would be stiff and painful, but after she took a hot shower and started moving it would ease up, then after some activity the pain would increase again. To ease the pain the pt mainly just tried to stay off of the leg. Pt stated that when she did have to walk she "walked like a rocking chair". Pt still can not walk for long durations, but she reports that it is more due to fatigue than pain. Pt has no previous back, hip, knee or ankle injuries that she can recall. Pt describes herself as sedentary and does no perform much physical activity. Pt enjoys reading and would like to be able to walk around [place] and the grocery store. Pt lives in a single level home with no stairs.  ? ?PRECAUTIONS: None ? ?SUBJECTIVE: Pt reports no pain or stiffness in her knee upon arrival. No significant updates since her last therapy session. She is still struggling to be consistent with her HEP. No specific questions upon arrival.  ? ?PAIN:  ?Are you having pain? No ? ? ?TODAY'S TREATMENT:  ? ?   ?There Ex ?NuStep (seat 6, arms 7)  L2-5 x 5 minutes for warm-up during interval history (4 minutes unbilled); ?Total Gym (TG) Level 22 (L22) double leg partial squats 2 x 20 ?TG L22 heel raises 2 x 25; ?Supine L SLR with 4# ankle weight (AW) x 15, x 20; ?Hooklying bridges x 20, added bridge march x 10 BLE; ?Supine L SAQ with manual resistance from therapist 2 x 15; ?Prone L hamstring curl with 4# AW x 15, x 10; ?Prone hip extension with 4# AW 2 x 10; ?Seated LAQ with manual resistance from therapist 2 x 15; ?Standing L knee TKR with blue tband resistance 2 x 15; ? ? ?Not performed today: ?Forward BOSU lunges (round side up) in // bars with BUE support and LLE forward 2 x 10; ?Left lateral BOSU lunges (round side up) in // bars with BUE support x 10,  second set not performed secondary to pain; ?Side stepping in // bars with green tband above knees 2 x 4 lengths; ?Seated LLE hamstring with manual resistance 2 x 10; ?Seated clams with green tband 2 x 10; ?Seated adductor ball squeeze 2 x 10; ?R sidelying L hip abduction with 4# AW 2 x 15; ?R sidelying L reverse clams with 4# AW 2 x 15; ? ?  ?  ?PATIENT EDUCATION: ?Education details: Pt educated throughout session about proper posture and technique with exercises. Improved exercise technique, movement at target joints, use of target muscles after min to mod verbal, visual, tactile cues. ?Person educated: Patient ?Education method: Explanation ?Education comprehension: verbalized understanding and returned demonstration ? ? ?HOME EXERCISE PROGRAM: ?Access Code: [email or web address] ? ? ? PT Short Term Goals  ? ?  ? PT SHORT TERM GOAL #1  ? Title Pt will be independent with HEP in order to decrease L knee pain and increase strength in order to improve pain-free function at home.   ? Time 3   ? Period Weeks   ? Status Achieved  ? Target Date   ? ?  ?  ? ?  ? ? ? PT Long Term Goals  ? ?  ? PT LONG TERM GOAL #1  ? Title Pt will decrease 5TSTS time by at least 3s in order to demonstrate clinically significant improvement in LE strength   ? Baseline 03/28/21: 15.97s; 04/23/21: 14.6s   ? Time 6   ? Period Weeks   ? Status Partially Met   ? Target Date 06/23/2021   ?  ? PT LONG TERM GOAL #2  ? Title Pt will increase 10MWT by at least 0.13 m/s in order to demonstrate clinically significant improvement in household and community ambulation to decrease risk for falls.   ? Baseline 03/28/21: 0.82m/s; 04/23/21: self-selected: 0.74 m/s   ? Time 6   ? Period Weeks   ? Status On-going   ? Target Date 06/23/2021   ?  ? PT LONG TERM GOAL #3  ? Title Pt will increase L knee extension to 0 deg. in order to improve functional ability in walking and standing   ? Baseline 03/28/21: Lacking 5 deg form TKE; 04/23/21: 0 degrees   ? Time 6   ? Period Weeks   ?  Status Achieved   ? Target Date   ?  ? PT LONG TERM GOAL #4  ? Title Pt will increase FOTO to at least 64 (target score) in order to demonstrate signifcant improvement in function related to L knee pain and buckling.   ? Baseline 03/28/21: 50; 04/23/21: 50   ? Time  6   ? Period Weeks   ? Status On-going   ? Target Date 06/23/2021   ? ?  ?  ? ?  ? ? ? Plan - 04/30/21 1016   ? ? Clinical Impression Statement Continued strengthening during session today. Reinforced the importance of HEP. Will continue to progress pain-free strengthening in future sessions. Will continue to progress toward independent home program and discharge over the coming weeks. Pt would benefit from continued PT services in order to address deficits in ROM, gait, and LE strength in order to improve daily function and quality of life.   ? Personal Factors and Comorbidities Fitness;Comorbidity 3+;Past/Current Experience;Time since onset of injury/illness/exacerbation   ? Comorbidities Knee OA   ? Examination-Activity Limitations Stand;Locomotion Level;Squat   ? Examination-Participation Restrictions Shop;Community Activity;Valla Leaver Work   ? Stability/Clinical Decision Making Stable/Uncomplicated   ? Rehab Potential Good   ? PT Frequency 1x / week   ? PT Duration 6 weeks   ? PT Treatment/Interventions Electrical Stimulation;Cryotherapy;Iontophoresis 4mg /ml Dexamethasone;Ultrasound;Gait training;Functional mobility training;Therapeutic activities;Therapeutic exercise;Balance training;Manual techniques;Passive range of motion;Dry needling;Vestibular;Spinal Manipulations;Joint Manipulations;ADLs/Self Care Home Management;Aquatic Therapy;Moist Heat;Stair training;Neuromuscular re-education;Patient/family education   ? PT Next Visit Plan Reassess HEP, Functional strengthening, stretching hamstrings and hip flexors   ? PT [place]   ? Consulted and Agree with Plan of Care Patient   ? ?  ?  ? ?  ? ? ? ?Lyndel Safe Letha Mirabal PT, DPT,  GCS ?Divon Krabill, PT ?06/02/2021, 1:10 PM ? ?   ?

## 2021-06-10 NOTE — Therapy (Incomplete)
?OUTPATIENT PHYSICAL THERAPY TREATMENT NOTE ? ? ?Patient Name: Darlene Mcdaniel ?MRN: [id number] ?DOB:07-Mar-1950, 71 y.o., female ?Today's Date: 06/10/2021 ? ?PCP: Jearld Fenton, NP ?REFERRING PROVIDER: Montel Culver, MD ? ? ? ? ? ? ?Past Medical History:  ?Diagnosis Date  ? Hyperlipidemia   ? Hypertension   ? ?No past surgical history on file. ?Patient Active Problem List  ? Diagnosis Date Noted  ? Mixed incontinence 03/10/2021  ? Class 1 obesity due to excess calories with body mass index (BMI) of 31.0 to 31.9 in adult 02/13/2021  ? Primary osteoarthritis of left knee 06/18/2020  ? Obstructive sleep apnea 07/18/2019  ? Hyperlipidemia 06/21/2019  ? Hypertension 05/23/2019  ? ? ?REFERRING DIAG: M17.12 (ICD-10-CM) - Primary osteoarthritis of left knee  ? ?THERAPY DIAG: Chronic pain of left knee ? ?Muscle weakness (generalized) ? ?PERTINENT HISTORY: Pt has history of L knee pain that started ~1 year ago and began gradually getting worse. ~6 weeks ago pain got so intense that the throbbing would wake her up from her sleep in the middle of the night. Went to go see her GP who referred her to Dr. Zigmund Daniel who gave her an injection in her knee on 02/24/21. Since the injection her knee pain has significantly decreased, but she has been experiencing some buckling in her knee. She reports no falls with the buckling. Pt reports still occasionally feeling the pain in her L knee, but no where near as bad as it had been before the injection. Prior to the injection the pt stated that she was an entirely different person due to the amount of pain she was in. Prior to the injection the pain at its worst would reach a 10/10 on the NPRS. Pt reports the pain was constant and throbbing. Pt stated that in the mornings her knee would be stiff and painful, but after she took a hot shower and started moving it would ease up, then after some activity the pain would increase again. To ease the pain the pt mainly just tried to stay off of the  leg. Pt stated that when she did have to walk she "walked like a rocking chair". Pt still can not walk for long durations, but she reports that it is more due to fatigue than pain. Pt has no previous back, hip, knee or ankle injuries that she can recall. Pt describes herself as sedentary and does no perform much physical activity. Pt enjoys reading and would like to be able to walk around [place] and the grocery store. Pt lives in a single level home with no stairs.  ? ?PRECAUTIONS: None ? ?SUBJECTIVE: Pt reports no pain or stiffness in her knee upon arrival. No significant updates since her last therapy session. She is still struggling to be consistent with her HEP. No specific questions upon arrival.  ? ?PAIN:  ?Are you having pain? No ? ? ?TODAY'S TREATMENT:  ? ?   ?There Ex ?NuStep (seat 6, arms 7) L2-5 x 5 minutes for warm-up during interval history (4 minutes unbilled); ?Total Gym (TG) Level 22 (L22) double leg partial squats 2 x 20 ?TG L22 heel raises 2 x 25; ?Supine L SLR with 4# ankle weight (AW) x 15, x 20; ?Hooklying bridges x 20, added bridge march x 10 BLE; ?Supine L SAQ with manual resistance from therapist 2 x 15; ?Prone L hamstring curl with 4# AW x 15, x 10; ?Prone hip extension with 4# AW 2 x 10; ?Seated LAQ with manual resistance  from therapist 2 x 15; ?Standing L knee TKR with blue tband resistance 2 x 15; ? ? ?Not performed today: ?Forward BOSU lunges (round side up) in // bars with BUE support and LLE forward 2 x 10; ?Left lateral BOSU lunges (round side up) in // bars with BUE support x 10, second set not performed secondary to pain; ?Side stepping in // bars with green tband above knees 2 x 4 lengths; ?Seated LLE hamstring with manual resistance 2 x 10; ?Seated clams with green tband 2 x 10; ?Seated adductor ball squeeze 2 x 10; ?R sidelying L hip abduction with 4# AW 2 x 15; ?R sidelying L reverse clams with 4# AW 2 x 15; ? ?  ?  ?PATIENT EDUCATION: ?Education details: Pt educated throughout  session about proper posture and technique with exercises. Improved exercise technique, movement at target joints, use of target muscles after min to mod verbal, visual, tactile cues. ?Person educated: Patient ?Education method: Explanation ?Education comprehension: verbalized understanding and returned demonstration ? ? ?HOME EXERCISE PROGRAM: ?Access Code: [email or web address] ? ? ? PT Short Term Goals  ? ?  ? PT SHORT TERM GOAL #1  ? Title Pt will be independent with HEP in order to decrease L knee pain and increase strength in order to improve pain-free function at home.   ? Time 3   ? Period Weeks   ? Status Achieved  ? Target Date   ? ?  ?  ? ?  ? ? ? PT Long Term Goals  ? ?  ? PT LONG TERM GOAL #1  ? Title Pt will decrease 5TSTS time by at least 3s in order to demonstrate clinically significant improvement in LE strength   ? Baseline 03/28/21: 15.97s; 04/23/21: 14.6s   ? Time 6   ? Period Weeks   ? Status Partially Met   ? Target Date 06/23/2021   ?  ? PT LONG TERM GOAL #2  ? Title Pt will increase 10MWT by at least 0.13 m/s in order to demonstrate clinically significant improvement in household and community ambulation to decrease risk for falls.   ? Baseline 03/28/21: 0.70m/s; 04/23/21: self-selected: 0.74 m/s   ? Time 6   ? Period Weeks   ? Status On-going   ? Target Date 06/23/2021   ?  ? PT LONG TERM GOAL #3  ? Title Pt will increase L knee extension to 0 deg. in order to improve functional ability in walking and standing   ? Baseline 03/28/21: Lacking 5 deg form TKE; 04/23/21: 0 degrees   ? Time 6   ? Period Weeks   ? Status Achieved   ? Target Date   ?  ? PT LONG TERM GOAL #4  ? Title Pt will increase FOTO to at least 64 (target score) in order to demonstrate signifcant improvement in function related to L knee pain and buckling.   ? Baseline 03/28/21: 50; 04/23/21: 50   ? Time 6   ? Period Weeks   ? Status On-going   ? Target Date 06/23/2021   ? ?  ?  ? ?  ? ? ? Plan - 04/30/21 1016   ? ? Clinical Impression Statement  Continued strengthening during session today. Reinforced the importance of HEP. Will continue to progress pain-free strengthening in future sessions. Will continue to progress toward independent home program and discharge over the coming weeks. Pt would benefit from continued PT services in order to address deficits in ROM, gait, and  LE strength in order to improve daily function and quality of life.   ? Personal Factors and Comorbidities Fitness;Comorbidity 3+;Past/Current Experience;Time since onset of injury/illness/exacerbation   ? Comorbidities Knee OA   ? Examination-Activity Limitations Stand;Locomotion Level;Squat   ? Examination-Participation Restrictions Shop;Community Activity;[employer] Work   ? Stability/Clinical Decision Making Stable/Uncomplicated   ? Rehab Potential Good   ? PT Frequency 1x / week   ? PT Duration 6 weeks   ? PT Treatment/Interventions Electrical Stimulation;Cryotherapy;Iontophoresis 4mg /ml Dexamethasone;Ultrasound;Gait training;Functional mobility training;Therapeutic activities;Therapeutic exercise;Balance training;Manual techniques;Passive range of motion;Dry needling;Vestibular;Spinal Manipulations;Joint Manipulations;ADLs/Self Care Home Management;Aquatic Therapy;Moist Heat;Stair training;Neuromuscular re-education;Patient/family education   ? PT Next Visit Plan Reassess HEP, Functional strengthening, stretching hamstrings and hip flexors   ? PT [place]   ? Consulted and Agree with Plan of Care Patient   ? ?  ?  ? ?  ? ? ? ?Darlene Mcdaniel PT, DPT, GCS ?Darlene Mcdaniel, PT ?06/10/2021, 9:07 AM ? ?   ?

## 2021-06-11 ENCOUNTER — Ambulatory Visit: Payer: Medicare Other

## 2021-06-11 DIAGNOSIS — G8929 Other chronic pain: Secondary | ICD-10-CM

## 2021-06-11 DIAGNOSIS — M6281 Muscle weakness (generalized): Secondary | ICD-10-CM

## 2021-06-16 ENCOUNTER — Ambulatory Visit: Payer: Medicare Other

## 2021-06-16 DIAGNOSIS — G8929 Other chronic pain: Secondary | ICD-10-CM | POA: Diagnosis not present

## 2021-06-16 DIAGNOSIS — M25562 Pain in left knee: Secondary | ICD-10-CM | POA: Diagnosis not present

## 2021-06-16 DIAGNOSIS — M6281 Muscle weakness (generalized): Secondary | ICD-10-CM

## 2021-06-16 DIAGNOSIS — R2689 Other abnormalities of gait and mobility: Secondary | ICD-10-CM | POA: Diagnosis not present

## 2021-06-16 NOTE — Therapy (Signed)
?OUTPATIENT PHYSICAL THERAPY TREATMENT NOTE ? ? ?Patient Name: Darlene Mcdaniel ?MRN: [id number] ?DOB:04-07-1950, 71 y.o., female ?Today's Date: 06/16/2021 ? ?PCP: Jearld Fenton, NP ?REFERRING PROVIDER: Montel Culver, MD ? ? PT End of Session - 06/16/21 1013   ? ? Visit Number 14   ? Number of Visits 19   ? Date for PT Re-Evaluation 06/23/21   ? Authorization Type eval: 5/32/99, recertification: 2/42/68   ? PT Start Time 1015   ? PT Stop Time 1100   ? PT Time Calculation (min) 45 min   ? Activity Tolerance Patient tolerated treatment well   ? Behavior During Therapy [facility] for tasks assessed/performed   ? ?  ?  ? ?  ? ? ? ? ? ?Past Medical History:  ?Diagnosis Date  ? Hyperlipidemia   ? Hypertension   ? ?History reviewed. No pertinent surgical history. ?Patient Active Problem List  ? Diagnosis Date Noted  ? Mixed incontinence 03/10/2021  ? Class 1 obesity due to excess calories with body mass index (BMI) of 31.0 to 31.9 in adult 02/13/2021  ? Primary osteoarthritis of left knee 06/18/2020  ? Obstructive sleep apnea 07/18/2019  ? Hyperlipidemia 06/21/2019  ? Hypertension 05/23/2019  ? ? ?REFERRING DIAG: M17.12 (ICD-10-CM) - Primary osteoarthritis of left knee  ? ?THERAPY DIAG: Chronic pain of left knee ? ?Muscle weakness (generalized) ? ?PERTINENT HISTORY: Pt has history of L knee pain that started ~1 year ago and began gradually getting worse. ~6 weeks ago pain got so intense that the throbbing would wake her up from her sleep in the middle of the night. Went to go see her GP who referred her to Dr. Zigmund Daniel who gave her an injection in her knee on 02/24/21. Since the injection her knee pain has significantly decreased, but she has been experiencing some buckling in her knee. She reports no falls with the buckling. Pt reports still occasionally feeling the pain in her L knee, but no where near as bad as it had been before the injection. Prior to the injection the pt stated that she was an entirely different person due  to the amount of pain she was in. Prior to the injection the pain at its worst would reach a 10/10 on the NPRS. Pt reports the pain was constant and throbbing. Pt stated that in the mornings her knee would be stiff and painful, but after she took a hot shower and started moving it would ease up, then after some activity the pain would increase again. To ease the pain the pt mainly just tried to stay off of the leg. Pt stated that when she did have to walk she "walked like a rocking chair". Pt still can not walk for long durations, but she reports that it is more due to fatigue than pain. Pt has no previous back, hip, knee or ankle injuries that she can recall. Pt describes herself as sedentary and does no perform much physical activity. Pt enjoys reading and would like to be able to walk around [place] and the grocery store. Pt lives in a single level home with no stairs.  ? ?PRECAUTIONS: None ? ?SUBJECTIVE: Pt reports no pain or stiffness in her knee upon arrival. No significant updates since her last therapy session. She has performed her HEP since the last therapy session (at least one time). No specific questions upon arrival.  ? ?PAIN:  ?Are you having pain? No ? ? ?TODAY'S TREATMENT:  ? ?   ?There Ex ?  NuStep (seat 6, arms 7) L2-4 x 5 minutes for warm-up during interval history (4 minutes unbilled); ?Total Gym (TG) Level 22 (L22) double leg partial squats 2 x 20 ?TG L22 heel raises 2 x 25; ?Standing L knee TKR with blue tband resistance 2 x 15; ?Side stepping in // bars with green tband above knees x 4 lengths, x 6 lengths; ?Seated marches with green tband around knees 2 x 20; ?Supine L SLR with 4# ankle weight (AW) 2 x 15; ?Hooklying bridges 2 x 15 with RLE slightly extended to bias LLE; ?Hooklying clams with manual resistance from therapist x 15; ?Hooklying adductor squeeze with manual resistance from therapist x 15; ? ? ?Not performed today: ?Forward BOSU lunges (round side up) in // bars with BUE support and  LLE forward 2 x 10; ?Left lateral BOSU lunges (round side up) in // bars with BUE support x 10, second set not performed secondary to pain; ?Seated LLE hamstring with manual resistance 2 x 10; ?R sidelying L hip abduction with 4# AW 2 x 15; ?R sidelying L reverse clams with 4# AW 2 x 15; ?Supine L SAQ with manual resistance from therapist 2 x 15; ?Prone L hamstring curl with 4# AW x 15, x 10; ?Prone hip extension with 4# AW 2 x 10; ?Seated LAQ with manual resistance from therapist 2 x 15; ? ?  ?  ?PATIENT EDUCATION: ?Education details: Pt educated throughout session about proper posture and technique with exercises. Improved exercise technique, movement at target joints, use of target muscles after min to mod verbal, visual, tactile cues. ?Person educated: Patient ?Education method: Explanation ?Education comprehension: verbalized understanding and returned demonstration ? ? ?HOME EXERCISE PROGRAM: ?Access Code: [email or web address] ? ? ? PT Short Term Goals  ? ?  ? PT SHORT TERM GOAL #1  ? Title Pt will be independent with HEP in order to decrease L knee pain and increase strength in order to improve pain-free function at home.   ? Time 3   ? Period Weeks   ? Status Achieved  ? Target Date   ? ?  ?  ? ?  ? ? ? PT Long Term Goals  ? ?  ? PT LONG TERM GOAL #1  ? Title Pt will decrease 5TSTS time by at least 3s in order to demonstrate clinically significant improvement in LE strength   ? Baseline 03/28/21: 15.97s; 04/23/21: 14.6s   ? Time 6   ? Period Weeks   ? Status Partially Met   ? Target Date 06/23/2021   ?  ? PT LONG TERM GOAL #2  ? Title Pt will increase 10MWT by at least 0.13 m/s in order to demonstrate clinically significant improvement in household and community ambulation to decrease risk for falls.   ? Baseline 03/28/21: 0.97ms; 04/23/21: self-selected: 0.74 m/s   ? Time 6   ? Period Weeks   ? Status On-going   ? Target Date 06/23/2021   ?  ? PT LONG TERM GOAL #3  ? Title Pt will increase L knee extension to 0 deg. in  order to improve functional ability in walking and standing   ? Baseline 03/28/21: Lacking 5 deg form TKE; 04/23/21: 0 degrees   ? Time 6   ? Period Weeks   ? Status Achieved   ? Target Date   ?  ? PT LONG TERM GOAL #4  ? Title Pt will increase FOTO to at least 64 (target score) in order to demonstrate signifcant  improvement in function related to L knee pain and buckling.   ? Baseline 03/28/21: 50; 04/23/21: 50   ? Time 6   ? Period Weeks   ? Status On-going   ? Target Date 06/23/2021   ? ?  ?  ? ?  ? ? ? Plan - 04/30/21 1016   ? ? Clinical Impression Statement Continued strengthening during session today. Reinforced the importance of HEP. Will continue to progress pain-free strengthening in future sessions. She has one more appointment at which point outcome measures/goals will be updated and pt will be discharged. Pt would benefit from continued PT services in order to address deficits in ROM, gait, and LE strength in order to improve daily function and quality of life.   ? Personal Factors and Comorbidities Fitness;Comorbidity 3+;Past/Current Experience;Time since onset of injury/illness/exacerbation   ? Comorbidities Knee OA   ? Examination-Activity Limitations Stand;Locomotion Level;Squat   ? Examination-Participation Restrictions Shop;Community Activity;[employer] Work   ? Stability/Clinical Decision Making Stable/Uncomplicated   ? Rehab Potential Good   ? PT Frequency 1x / week   ? PT Duration 6 weeks   ? PT Treatment/Interventions Electrical Stimulation;Cryotherapy;Iontophoresis 49m/ml Dexamethasone;Ultrasound;Gait training;Functional mobility training;Therapeutic activities;Therapeutic exercise;Balance training;Manual techniques;Passive range of motion;Dry needling;Vestibular;Spinal Manipulations;Joint Manipulations;ADLs/Self Care Home Management;Aquatic Therapy;Moist Heat;Stair training;Neuromuscular re-education;Patient/family education   ? PT Next Visit Plan Reassess HEP, Functional strengthening, stretching  hamstrings and hip flexors   ? PT [place]  ? Consulted and Agree with Plan of Care Patient   ? ?  ?  ? ?  ? ? ? ?JLyndel SafeHuprich PT, DPT, GCS ?Aftyn Nott, PT ?06/16/2021, 1:14 PM ? ?

## 2021-06-19 ENCOUNTER — Ambulatory Visit (INDEPENDENT_AMBULATORY_CARE_PROVIDER_SITE_OTHER): Payer: Medicare Other | Admitting: Internal Medicine

## 2021-06-19 ENCOUNTER — Other Ambulatory Visit: Payer: Self-pay | Admitting: Internal Medicine

## 2021-06-19 ENCOUNTER — Encounter: Payer: Self-pay | Admitting: Internal Medicine

## 2021-06-19 VITALS — BP 110/64 | HR 70 | Temp 97.3°F | Ht 60.0 in | Wt 165.0 lb

## 2021-06-19 DIAGNOSIS — R7309 Other abnormal glucose: Secondary | ICD-10-CM | POA: Diagnosis not present

## 2021-06-19 DIAGNOSIS — E782 Mixed hyperlipidemia: Secondary | ICD-10-CM

## 2021-06-19 DIAGNOSIS — I10 Essential (primary) hypertension: Secondary | ICD-10-CM | POA: Diagnosis not present

## 2021-06-19 DIAGNOSIS — J439 Emphysema, unspecified: Secondary | ICD-10-CM | POA: Diagnosis not present

## 2021-06-19 DIAGNOSIS — I700 Atherosclerosis of aorta: Secondary | ICD-10-CM | POA: Diagnosis not present

## 2021-06-19 DIAGNOSIS — Z1231 Encounter for screening mammogram for malignant neoplasm of breast: Secondary | ICD-10-CM

## 2021-06-19 DIAGNOSIS — Z0001 Encounter for general adult medical examination with abnormal findings: Secondary | ICD-10-CM

## 2021-06-19 DIAGNOSIS — E2839 Other primary ovarian failure: Secondary | ICD-10-CM

## 2021-06-19 DIAGNOSIS — Z6832 Body mass index (BMI) 32.0-32.9, adult: Secondary | ICD-10-CM

## 2021-06-19 DIAGNOSIS — E6609 Other obesity due to excess calories: Secondary | ICD-10-CM

## 2021-06-19 DIAGNOSIS — J069 Acute upper respiratory infection, unspecified: Secondary | ICD-10-CM | POA: Diagnosis not present

## 2021-06-19 DIAGNOSIS — E785 Hyperlipidemia, unspecified: Secondary | ICD-10-CM | POA: Diagnosis not present

## 2021-06-19 MED ORDER — OXYBUTYNIN CHLORIDE ER 5 MG PO TB24: 5.0000 mg | ORAL_TABLET | Freq: Every day | ORAL | 2 refills | Status: DC

## 2021-06-19 MED ORDER — ASPIRIN 81 MG PO TBEC: 81.0000 mg | DELAYED_RELEASE_TABLET | Freq: Every day | ORAL | 1 refills | Status: AC

## 2021-06-19 MED ORDER — OMEPRAZOLE 20 MG PO CPDR
20.0000 mg | DELAYED_RELEASE_CAPSULE | Freq: Every day | ORAL | 1 refills | Status: DC
Start: 2021-06-19 — End: 2021-12-01

## 2021-06-19 MED ORDER — MELOXICAM 7.5 MG PO TABS: 7.5000 mg | ORAL_TABLET | Freq: Two times a day (BID) | ORAL | 1 refills | Status: DC

## 2021-06-19 NOTE — Assessment & Plan Note (Signed)
Encouraged smoking cessation She is not ready to quit at this time CT low dose lung cancer screening ordered

## 2021-06-19 NOTE — Assessment & Plan Note (Signed)
CMET and lipid profile today Encouraged her to consume a low fat diet Will start Aspirin 81 mg daily

## 2021-06-19 NOTE — Progress Notes (Signed)
Subjective:    Patient ID: Darlene Mcdaniel, female    DOB: 1950-02-18, 71 y.o.   MRN: [id number]  HPI  Pt presents to the clinic today for her annual exam.  Flu: 11/2019 Tetanus: unsure Covid: Moderna x 3 Pneumovax: 06/2020 Prevnar: 06/2019 Shingrix: never Pap smear: 06/2019 Mammogram: 12/2020 Bone density: never Colon screening: 06/2019, Cologuard Vision screening: annually Dentist: biannually  Diet: She does eat meat. She consumes fruits and veggies. She tries to avoid fried foods. She drinks mostly juice or water. Exercise: PT  Review of Systems     Past Medical History:  Diagnosis Date   Hyperlipidemia    Hypertension     Current Outpatient Medications  Medication Sig Dispense Refill   acetaminophen (TYLENOL) 650 MG CR tablet Take 650 mg by mouth every 8 (eight) hours as needed for pain. (Patient not taking: Reported on 04/03/2021)     amLODipine (NORVASC) 10 MG tablet Take 1 tablet (10 mg total) by mouth daily. 90 tablet 2   atorvastatin (LIPITOR) 40 MG tablet Take 1 tablet (40 mg total) by mouth daily. 90 tablet 3   Blood Pressure Monitoring (BLOOD PRESSURE KIT) DEVI 1 Units by Does not apply route 2 (two) times daily. 1 each 0   cetirizine (ZYRTEC) 10 MG tablet Take 10 mg by mouth every other day.     diclofenac Sodium (VOLTAREN) 1 % GEL Apply 2 g topically 4 (four) times daily. 50 g 1   losartan-hydrochlorothiazide (HYZAAR) 100-12.5 MG tablet TAKE 1 TABLET BY MOUTH DAILY 90 tablet 3   meloxicam (MOBIC) 7.5 MG tablet TAKE 1 TO 2 TABLETS(7.5 TO 15 MG) BY MOUTH DAILY AS NEEDED FOR PAIN 60 tablet 0   oxybutynin (DITROPAN XL) 5 MG 24 hr tablet Take 1 tablet (5 mg total) by mouth at bedtime. 30 tablet 2   No current facility-administered medications for this visit.    No Known Allergies  Family History  Problem Relation Age of Onset   Heart disease Mother    Stroke Mother    Diabetes Mother    Cancer Mother    Leukemia Mother    Breast cancer Sister     Social  History   Socioeconomic History   Marital status: Single    Spouse name: Not on file   Number of children: 2   Years of education: Not on file   Highest education level: Not on file  Occupational History   Occupation: retired   Occupation: Retired  Tobacco Use   Smoking status: Every Day    Packs/day: 0.50    Years: 35.00    Pack years: 17.50    Types: Cigarettes   Smokeless tobacco: Never  Vaping Use   Vaping Use: Never used  Substance and Sexual Activity   Alcohol use: Not Currently   Drug use: Never   Sexual activity: Yes    Partners: Male  Other Topics Concern   Not on file  Social History Narrative   Not on file   Social Determinants of Health   Financial Resource Strain: Low Risk    Difficulty of Paying Living Expenses: Not hard at all  Food Insecurity: No Food Insecurity   Worried About Programme researcher, broadcasting/film/video in the Last Year: Never true   Ran Out of Food in the Last Year: Never true  Transportation Needs: No Transportation Needs   Lack of Transportation (Medical): No   Lack of Transportation (Non-Medical): No  Physical Activity: Sufficiently Active   Days of  Exercise per Week: 5 days   Minutes of Exercise per Session: 60 min  Stress: No Stress Concern Present   Feeling of Stress : Not at all  Social Connections: Moderately Integrated   Frequency of Communication with Friends and Family: More than three times a week   Frequency of Social Gatherings with Friends and Family: More than three times a week   Attends Religious Services: More than 4 times per year   Active Member of [employer] or Organizations: Yes   Attends Music therapist: More than 4 times per year   Marital Status: Separated  Intimate Partner Violence: Not At Risk   Fear of Current or Ex-Partner: No   Emotionally Abused: No   Physically Abused: No   Sexually Abused: No     Constitutional: Denies fever, malaise, fatigue, headache or abrupt weight changes.  HEENT: Denies eye pain,  eye redness, ear pain, ringing in the ears, wax buildup, runny nose, nasal congestion, bloody nose, or sore throat. Respiratory: Denies difficulty breathing, shortness of breath, cough or sputum production.   Cardiovascular: Denies chest pain, chest tightness, palpitations or swelling in the hands or feet.  Gastrointestinal: Pt reports frequent reflux. Denies abdominal pain, bloating, constipation, diarrhea or blood in the stool.  GU: Pt reports urinary incontinence. Denies urgency, frequency, pain with urination, burning sensation, blood in urine, odor or discharge. Musculoskeletal: Pt reports intermittent knee pain. Denies decrease in range of motion, difficulty with gait, muscle pain or joint swelling.  Skin: Denies redness, rashes, lesions or ulcercations.  Neurological: Denies dizziness, difficulty with memory, difficulty with speech or problems with balance and coordination.  Psych: Denies anxiety, depression, SI/HI.  No other specific complaints in a complete review of systems (except as listed in HPI above).  Objective:   Physical Exam   BP 110/64 (BP Location: Left Arm, Patient Position: Sitting, Cuff Size: Normal)   Pulse 70   Temp (!) 97.3 F (36.3 C) (Temporal)   Ht 5' (1.524 m)   Wt 165 lb (74.8 kg)   SpO2 97%   BMI 32.22 kg/m   Wt Readings from Last 3 Encounters:  03/26/21 162 lb (73.5 kg)  03/10/21 159 lb (72.1 kg)  02/24/21 159 lb (72.1 kg)    General: Appears her stated age, obese, in NAD. Skin: Warm, dry and intact.  HEENT: Head: normal shape and size; Eyes: sclera white, no icterus, conjunctiva pink, PERRLA and EOMs intact;  Neck:  Neck supple, trachea midline. No masses, lumps or thyromegaly present.  Cardiovascular: Normal rate and rhythm. S1,S2 noted.  No murmur, rubs or gallops noted. No JVD or BLE edema. No carotid bruits noted. Pulmonary/Chest: Normal effort and positive vesicular breath sounds. No respiratory distress. No wheezes, rales or ronchi noted.   Abdomen: Soft and nontender. Normal bowel sounds.  Musculoskeletal: Strength 5/5 BUE/BLE. Limping gait. Neurological: Alert and oriented. Cranial nerves II-XII grossly intact. Coordination normal.  Psychiatric: Mood and affect normal. Behavior is normal. Judgment and thought content normal.    BMET    Component Value Date/Time   NA 143 06/19/2020 0801   K 4.1 06/19/2020 0801   CL 110 06/19/2020 0801   CO2 23 06/19/2020 0801   GLUCOSE 98 06/19/2020 0801   BUN 19 06/19/2020 0801   CREATININE 0.87 06/19/2020 0801   CALCIUM 9.0 06/19/2020 0801   GFRNONAA 98 05/23/2019 1110   GFRAA 113 05/23/2019 1110    Lipid Panel     Component Value Date/Time   CHOL 168 06/19/2020  0801   TRIG 147 06/19/2020 0801   HDL 58 06/19/2020 0801   CHOLHDL 2.9 06/19/2020 0801   LDLCALC 86 06/19/2020 0801    CBC    Component Value Date/Time   WBC 8.5 06/19/2020 0801   RBC 3.90 06/19/2020 0801   HGB 11.9 06/19/2020 0801   HCT 36.8 06/19/2020 0801   PLT 324 06/19/2020 0801   MCV 94.4 06/19/2020 0801   MCH 30.5 06/19/2020 0801   MCHC 32.3 06/19/2020 0801   RDW 13.1 06/19/2020 0801   LYMPHSABS 2,133 05/23/2019 1110   EOSABS 52 05/23/2019 1110   BASOSABS 52 05/23/2019 1110    Hgb A1C Lab Results  Component Value Date   HGBA1C 6.1 (H) 06/19/2020           Assessment & Plan:   Preventative Health Maintenance:  Encouraged her to get a flu shot in the fall She declines tetanus for financial reasons, advised her if she gets bit or cut to go to the pharmacya to have this done Encouraged her to get a covid booster Pneumovax and prenvar UTD Discussed Shingrix vaccine, she will check coverage with her insurance company and have this done at the pharmacy Pap smear UTD Mammogram and bone density ordered- she will call to schedule Colon screening UTD Encouraged her to consume a balanced diet and exercise regimen Advised her to see an eye doctor and dentist annually Will check CBC, CMET,  Lipid, A1C today  RTC in 6 months, follow up chronic conditions Webb Silversmith, NP

## 2021-06-19 NOTE — Patient Instructions (Signed)
Health Maintenance for Postmenopausal Women Menopause is a normal process in which your ability to get pregnant comes to an end. This process happens slowly over many months or years, usually between the ages of 48 and 55. Menopause is complete when you have missed your menstrual period for 12 months. It is important to talk with your health care provider about some of the most common conditions that affect women after menopause (postmenopausal women). These include heart disease, cancer, and bone loss (osteoporosis). Adopting a healthy lifestyle and getting preventive care can help to promote your health and wellness. The actions you take can also lower your chances of developing some of these common conditions. What are the signs and symptoms of menopause? During menopause, you may have the following symptoms: Hot flashes. These can be moderate or severe. Night sweats. Decrease in sex drive. Mood swings. Headaches. Tiredness (fatigue). Irritability. Memory problems. Problems falling asleep or staying asleep. Talk with your health care provider about treatment options for your symptoms. Do I need hormone replacement therapy? Hormone replacement therapy is effective in treating symptoms that are caused by menopause, such as hot flashes and night sweats. Hormone replacement carries certain risks, especially as you become older. If you are thinking about using estrogen or estrogen with progestin, discuss the benefits and risks with your health care provider. How can I reduce my risk for heart disease and stroke? The risk of heart disease, heart attack, and stroke increases as you age. One of the causes may be a change in the body's hormones during menopause. This can affect how your body uses dietary fats, triglycerides, and cholesterol. Heart attack and stroke are medical emergencies. There are many things that you can do to help prevent heart disease and stroke. Watch your blood pressure High  blood pressure causes heart disease and increases the risk of stroke. This is more likely to develop in people who have high blood pressure readings or are overweight. Have your blood pressure checked: Every 3-5 years if you are 18-39 years of age. Every year if you are 40 years old or older. Eat a healthy diet  Eat a diet that includes plenty of vegetables, fruits, low-fat dairy products, and lean protein. Do not eat a lot of foods that are high in solid fats, added sugars, or sodium. Get regular exercise Get regular exercise. This is one of the most important things you can do for your health. Most adults should: Try to exercise for at least 150 minutes each week. The exercise should increase your heart rate and make you sweat (moderate-intensity exercise). Try to do strengthening exercises at least twice each week. Do these in addition to the moderate-intensity exercise. Spend less time sitting. Even light physical activity can be beneficial. Other tips Work with your health care provider to achieve or maintain a healthy weight. Do not use any products that contain nicotine or tobacco. These products include cigarettes, chewing tobacco, and vaping devices, such as e-cigarettes. If you need help quitting, ask your health care provider. Know your numbers. Ask your health care provider to check your cholesterol and your blood sugar (glucose). Continue to have your blood tested as directed by your health care provider. Do I need screening for cancer? Depending on your health history and family history, you may need to have cancer screenings at different stages of your life. This may include screening for: Breast cancer. Cervical cancer. Lung cancer. Colorectal cancer. What is my risk for osteoporosis? After menopause, you may be   at increased risk for osteoporosis. Osteoporosis is a condition in which bone destruction happens more quickly than new bone creation. To help prevent osteoporosis or  the bone fractures that can happen because of osteoporosis, you may take the following actions: If you are 19-50 years old, get at least 1,000 mg of calcium and at least 600 international units (IU) of vitamin D per day. If you are older than age 50 but younger than age 70, get at least 1,200 mg of calcium and at least 600 international units (IU) of vitamin D per day. If you are older than age 70, get at least 1,200 mg of calcium and at least 800 international units (IU) of vitamin D per day. Smoking and drinking excessive alcohol increase the risk of osteoporosis. Eat foods that are rich in calcium and vitamin D, and do weight-bearing exercises several times each week as directed by your health care provider. How does menopause affect my mental health? Depression may occur at any age, but it is more common as you become older. Common symptoms of depression include: Feeling depressed. Changes in sleep patterns. Changes in appetite or eating patterns. Feeling an overall lack of motivation or enjoyment of activities that you previously enjoyed. Frequent crying spells. Talk with your health care provider if you think that you are experiencing any of these symptoms. General instructions See your health care provider for regular wellness exams and vaccines. This may include: Scheduling regular health, dental, and eye exams. Getting and maintaining your vaccines. These include: Influenza vaccine. Get this vaccine each year before the flu season begins. Pneumonia vaccine. Shingles vaccine. Tetanus, diphtheria, and pertussis (Tdap) booster vaccine. Your health care provider may also recommend other immunizations. Tell your health care provider if you have ever been abused or do not feel safe at home. Summary Menopause is a normal process in which your ability to get pregnant comes to an end. This condition causes hot flashes, night sweats, decreased interest in sex, mood swings, headaches, or lack  of sleep. Treatment for this condition may include hormone replacement therapy. Take actions to keep yourself healthy, including exercising regularly, eating a healthy diet, watching your weight, and checking your blood pressure and blood sugar levels. Get screened for cancer and depression. Make sure that you are up to date with all your vaccines. This information is not intended to replace advice given to you by your health care provider. Make sure you discuss any questions you have with your health care provider. Document Revised: 06/10/2020 Document Reviewed: 06/10/2020 Elsevier Patient Education  2023 Elsevier Inc.  

## 2021-06-19 NOTE — Assessment & Plan Note (Signed)
Encouraged diet and exercise for weight loss ?

## 2021-06-19 NOTE — Telephone Encounter (Signed)
Requested medication (s) are due for refill today: expired medication  Requested medication (s) are on the active medication list: yes  Last refill:  06/12/20 #90 3 refills  Future visit scheduled: seen today  Notes to clinic:  expired medication, do you want to renew Rx?     Requested Prescriptions  Pending Prescriptions Disp Refills   atorvastatin (LIPITOR) 40 MG tablet [Pharmacy Med Name: ATORVASTATIN 40MG  TABLETS] 90 tablet 3    Sig: TAKE 1 TABLET(40 MG) BY MOUTH DAILY     Cardiovascular:  Antilipid - Statins Failed - 06/19/2021  2:08 PM      Failed - Lipid Panel in normal range within the last 12 months    Cholesterol  Date Value Ref Range Status  06/19/2020 168 <200 mg/dL Final   LDL Cholesterol (Calc)  Date Value Ref Range Status  06/19/2020 86 mg/dL (calc) Final    Comment:    Reference range: <100 . Desirable range <100 mg/dL for primary prevention;   <70 mg/dL for patients with CHD or diabetic patients  with > or = 2 CHD risk factors. 06/21/2020 LDL-C is now calculated using the Martin-Hopkins  calculation, which is a validated novel method providing  better accuracy than the Friedewald equation in the  estimation of LDL-C.  Marland Kitchen et al. Horald Pollen. Lenox Ahr): 2061-2068  (http://education.[employer].com/faq/FAQ164)    HDL  Date Value Ref Range Status  06/19/2020 58 > OR = 50 mg/dL Final   Triglycerides  Date Value Ref Range Status  06/19/2020 147 <150 mg/dL Final         Passed - Patient is not pregnant      Passed - Valid encounter within last 12 months    Recent Outpatient Visits           Today Encounter for general adult medical examination with abnormal findings   [facility] [place], Mullins, NP   2 months ago Primary osteoarthritis of left knee   [facility], MD   3 months ago Mixed incontinence   [facility] [place], Mullins, NP   3 months ago Primary osteoarthritis of left knee    [facility], MD   4 months ago Chronic pain of left knee   [facility] [place], Mullins, Salvadore Oxford

## 2021-06-20 LAB — COMPLETE METABOLIC PANEL WITH GFR
AG Ratio: 1.3 (calc) (ref 1.0–2.5)
ALT: 13 U/L (ref 6–29)
AST: 12 U/L (ref 10–35)
Albumin: 4 g/dL (ref 3.6–5.1)
Alkaline phosphatase (APISO): 77 U/L (ref 37–153)
BUN/Creatinine Ratio: 26 (calc) — ABNORMAL HIGH (ref 6–22)
BUN: 29 mg/dL — ABNORMAL HIGH (ref 7–25)
CO2: 27 mmol/L (ref 20–32)
Calcium: 8.8 mg/dL (ref 8.6–10.4)
Chloride: 106 mmol/L (ref 98–110)
Creat: 1.11 mg/dL — ABNORMAL HIGH (ref 0.50–1.05)
Globulin: 3 g/dL (calc) (ref 1.9–3.7)
Glucose, Bld: 83 mg/dL (ref 65–139)
Potassium: 4.3 mmol/L (ref 3.5–5.3)
Sodium: 141 mmol/L (ref 135–146)
Total Bilirubin: 0.4 mg/dL (ref 0.2–1.2)
Total Protein: 7 g/dL (ref 6.1–8.1)
eGFR: 54 mL/min/{1.73_m2} — ABNORMAL LOW (ref >=60)

## 2021-06-20 LAB — HEMOGLOBIN A1C
Hgb A1c MFr Bld: 6.4 % of total Hgb — ABNORMAL HIGH (ref <5.7)
Mean Plasma Glucose: 137 mg/dL
eAG (mmol/L): 7.6 mmol/L

## 2021-06-20 LAB — LIPID PANEL
Cholesterol: 154 mg/dL (ref <200)
HDL: 45 mg/dL — ABNORMAL LOW (ref >=50)
LDL Cholesterol (Calc): 79 mg/dL (calc)
Non-HDL Cholesterol (Calc): 109 mg/dL (calc) (ref <130)
Total CHOL/HDL Ratio: 3.4 (calc) (ref <5.0)
Triglycerides: 208 mg/dL — ABNORMAL HIGH (ref <150)

## 2021-06-20 LAB — CBC
HCT: 36.1 % (ref 35.0–45.0)
Hemoglobin: 11.9 g/dL (ref 11.7–15.5)
MCH: 30.7 pg (ref 27.0–33.0)
MCHC: 33 g/dL (ref 32.0–36.0)
MCV: 93.3 fL (ref 80.0–100.0)
MPV: 10.6 fL (ref 7.5–12.5)
Platelets: 352 10*3/uL (ref 140–400)
RBC: 3.87 10*6/uL (ref 3.80–5.10)
RDW: 13 % (ref 11.0–15.0)
WBC: 8.1 10*3/uL (ref 3.8–10.8)

## 2021-06-23 ENCOUNTER — Ambulatory Visit: Payer: Medicare Other

## 2021-06-23 DIAGNOSIS — M6281 Muscle weakness (generalized): Secondary | ICD-10-CM | POA: Diagnosis not present

## 2021-06-23 DIAGNOSIS — R2689 Other abnormalities of gait and mobility: Secondary | ICD-10-CM

## 2021-06-23 DIAGNOSIS — G8929 Other chronic pain: Secondary | ICD-10-CM | POA: Diagnosis not present

## 2021-06-23 DIAGNOSIS — M25562 Pain in left knee: Secondary | ICD-10-CM | POA: Diagnosis not present

## 2021-06-23 NOTE — Therapy (Signed)
OUTPATIENT PHYSICAL THERAPY TREATMENT NOTE/DISCHARGE   Patient Name: Darlene Mcdaniel MRN: [id number] DOB:11/08/1950, 71 y.o., female Today's Date: 06/23/2021  PCP: Jearld Fenton, NP REFERRING PROVIDER: Montel Culver, MD   PT End of Session - 06/23/21 1018     Visit Number 15    Number of Visits 19    Date for PT Re-Evaluation 06/23/21    Authorization Type eval: 2/70/78, recertification: 6/75/44    PT Start Time 1016    PT Stop Time 1100    PT Time Calculation (min) 44 min    Activity Tolerance Patient tolerated treatment well    Behavior During Therapy [facility] for tasks assessed/performed              Past Medical History:  Diagnosis Date   Hyperlipidemia    Hypertension    Past Surgical History:  Procedure Laterality Date   NO PAST SURGERIES     Patient Active Problem List   Diagnosis Date Noted   Aortic atherosclerosis ([place]) 06/19/2021   Pulmonary emphysema ([place]) 06/19/2021   Mixed incontinence 03/10/2021   Class 1 obesity due to excess calories with body mass index (BMI) of 32.0 to 32.9 in adult 02/13/2021   Primary osteoarthritis of left knee 06/18/2020   Obstructive sleep apnea 07/18/2019   Hyperlipidemia 06/21/2019   Hypertension 05/23/2019    REFERRING DIAG: M17.12 (ICD-10-CM) - Primary osteoarthritis of left knee   THERAPY DIAG: Chronic pain of left knee  Muscle weakness (generalized)  Other abnormalities of gait and mobility  PERTINENT HISTORY: Pt has history of L knee pain that started ~1 year ago and began gradually getting worse. ~6 weeks ago pain got so intense that the throbbing would wake her up from her sleep in the middle of the night. Went to go see her GP who referred her to Dr. Zigmund Daniel who gave her an injection in her knee on 02/24/21. Since the injection her knee pain has significantly decreased, but she has been experiencing some buckling in her knee. She reports no falls with the buckling. Pt reports still occasionally feeling the  pain in her L knee, but no where near as bad as it had been before the injection. Prior to the injection the pt stated that she was an entirely different person due to the amount of pain she was in. Prior to the injection the pain at its worst would reach a 10/10 on the NPRS. Pt reports the pain was constant and throbbing. Pt stated that in the mornings her knee would be stiff and painful, but after she took a hot shower and started moving it would ease up, then after some activity the pain would increase again. To ease the pain the pt mainly just tried to stay off of the leg. Pt stated that when she did have to walk she "walked like a rocking chair". Pt still can not walk for long durations, but she reports that it is more due to fatigue than pain. Pt has no previous back, hip, knee or ankle injuries that she can recall. Pt describes herself as sedentary and does no perform much physical activity. Pt enjoys reading and would like to be able to walk around [place] and the grocery store. Pt lives in a single level home with no stairs.   PRECAUTIONS: None  SUBJECTIVE: Pt reports no pain or stiffness in her knee upon arrival. No significant updates since her last therapy session and she has not had any issues since that time. She has  performed her HEP since the last therapy session "one and a half times". No specific questions upon arrival.   PAIN:  Are you having pain? No   TODAY'S TREATMENT:      There Ex NuStep (seat 6, arms 7) L2-3 x 5 minutes for warm-up during interval history (3 minutes unbilled);  Updated outcome measures with patient: FOTO: 54 5TSTS: 10.8s 6m gait speed: self-selected: 10.3s = 0.97 m/s, fastest: 8.1 = 1.23 m/s L knee extension ROM: 0 degrees  Total Gym (TG) Level 22 (L22) double leg partial squats 2 x 20 TG L22 heel raises 2 x 25;  Standing exercises with 4# ankle weights (AW): Hip flexion marches x 10 BLE; Hip abduction x 10 BLE; HS curls x 10 BLE; Hip extension x  10 BLE;  Seated LAQ with 4# AW x 15 BLE; Standing L knee TKR with blue tband resistance 2x 15; Side stepping in // bars with 4# AW x 6 lengths; HEP review;   Not performed today: Forward BOSU lunges (round side up) in // bars with BUE support and LLE forward 2 x 10; Left lateral BOSU lunges (round side up) in // bars with BUE support x 10, second set not performed secondary to pain; Seated LLE hamstring curl with manual resistance 2 x 10; R sidelying L hip abduction with 4# AW 2 x 15; R sidelying L reverse clams with 4# AW 2 x 15; Supine L SAQ with manual resistance from therapist 2 x 15; Prone L hamstring curl with 4# AW x 15, x 10; Prone hip extension with 4# AW 2 x 10; Seated marches with green tband around knees 2 x 20; Supine L SLR with 4# ankle weight (AW) 2 x 15; Hooklying bridges 2 x 15 with RLE slightly extended to bias LLE; Hooklying clams with manual resistance from therapist x 15; Hooklying adductor squeeze with manual resistance from therapist x 15;      PATIENT EDUCATION: Education details: Pt educated throughout session about proper posture and technique with exercises. Improved exercise technique, movement at target joints, use of target muscles after min to mod verbal, visual, tactile cues. Discharge, HEP importance; Person educated: Patient Education method: Explanation Education comprehension: verbalized understanding and returned demonstration   HOME EXERCISE PROGRAM: Access Code: [email or web address]    PT Short Term Goals      PT SHORT TERM GOAL #1   Title Pt will be independent with HEP in order to decrease L knee pain and increase strength in order to improve pain-free function at home.    Time 3    Period Weeks    Status Achieved   Target Date              PT Long Term Goals      PT LONG TERM GOAL #1   Title Pt will decrease 5TSTS time by at least 3s in order to demonstrate clinically significant improvement in LE strength    Baseline 03/28/21:  15.97s; 04/23/21: 14.6s; 06/23/21: 10.8s   Time 6    Period Weeks    Status ACHIEVED   Target Date      PT LONG TERM GOAL #2   Title Pt will increase 10MWT by at least 0.13 m/s in order to demonstrate clinically significant improvement in household and community ambulation to decrease risk for falls.    Baseline 03/28/21: 0.59m/s; 04/23/21: self-selected: 0.74 m/s; 06/23/21: self-selected: 10.3s = 0.97 m/s;   Time 6    Period Weeks    Status ACHIEVED  Target Date      PT LONG TERM GOAL #3   Title Pt will increase L knee extension to 0 deg. in order to improve functional ability in walking and standing    Baseline 03/28/21: Lacking 5 deg form TKE; 04/23/21: 0 degrees; 06/23/21: 0 degrees   Time 6    Period Weeks    Status ACHIEVED   Target Date      PT LONG TERM GOAL #4   Title Pt will increase FOTO to at least 64 (target score) in order to demonstrate signifcant improvement in function related to L knee pain and buckling.    Baseline 03/28/21: 50; 04/23/21: 50; 06/23/21: 54   Time 6    Period Weeks    Status PARTIALLY MET   Target Date 06/23/2021              Plan - 04/30/21 1016     Clinical Impression Statement Updated outcome measures/goals with patient during session today.  Her FOTO score improved to 54 and her self-selected 5m gait speed improved to 0.97 m/seconds. Her L knee extension is still at 0 degrees of extension compared to the initial evaluation when she was lacking 5 degrees.  Her 5 Times Sit to Stand Test has decreased from 16.0 seconds to 10.8 seconds today. Focused additional time during session on light strengthening. Repeated Total Gym heel raises and squats today for partial weightbearing closed chain strengthening. HEP reviewed with patient and importance reinforced. Pt will be discharged on this date having met 3/4 of her long term goals.     Personal Factors and Comorbidities Fitness;Comorbidity 3+;Past/Current Experience;Time since onset of  injury/illness/exacerbation    Comorbidities Knee OA    Examination-Activity Limitations Stand;Locomotion Level;Squat    Examination-Participation Restrictions Shop;Community Activity;Yard Work    Stability/Clinical Decision Making Stable/Uncomplicated    Rehab Potential Good    PT Frequency 1x / week    PT Duration 6 weeks    PT Treatment/Interventions Electrical Stimulation;Cryotherapy;Iontophoresis 4mg /ml Dexamethasone;Ultrasound;Gait training;Functional mobility training;Therapeutic activities;Therapeutic exercise;Balance training;Manual techniques;Passive range of motion;Dry needling;Vestibular;Spinal Manipulations;Joint Manipulations;ADLs/Self Care Home Management;Aquatic Therapy;Moist Heat;Stair training;Neuromuscular re-education;Patient/family education    PT Next Visit Plan Discharge   PT Home Exercise Plan [email or web address]    Consulted and Agree with Plan of Care Patient              Phillips Grout PT, DPT, GCS Kameran Mcneese, PT 06/23/2021, 1:02 PM

## 2021-06-28 ENCOUNTER — Other Ambulatory Visit: Payer: Self-pay | Admitting: Internal Medicine

## 2021-06-28 DIAGNOSIS — I10 Essential (primary) hypertension: Secondary | ICD-10-CM

## 2021-07-01 NOTE — Telephone Encounter (Signed)
Requested Prescriptions  Pending Prescriptions Disp Refills  . amLODipine (NORVASC) 10 MG tablet [Pharmacy Med Name: AMLODIPINE BESYLATE 10MG  TABLETS] 90 tablet 2    Sig: TAKE 1 TABLET(10 MG) BY MOUTH DAILY     Cardiovascular: Calcium Channel Blockers 2 Passed - 06/28/2021  3:17 AM      Passed - Last BP in normal range    BP Readings from Last 1 Encounters:  06/19/21 110/64         Passed - Last Heart Rate in normal range    Pulse Readings from Last 1 Encounters:  06/19/21 70         Passed - Valid encounter within last 6 months    Recent Outpatient Visits          1 week ago Encounter for general adult medical examination with abnormal findings   [facility] [place], Mullins, NP   3 months ago Primary osteoarthritis of left knee   [facility], MD   3 months ago Mixed incontinence   [facility] [place], Mullins, NP   4 months ago Primary osteoarthritis of left knee   [facility], MD   4 months ago Chronic pain of left knee   [facility] [place], Mullins, NP      Future Appointments            In 2 months Baity, Salvadore [place], NP [facility] [place]

## 2021-07-25 ENCOUNTER — Ambulatory Visit (INDEPENDENT_AMBULATORY_CARE_PROVIDER_SITE_OTHER): Payer: Medicare Other

## 2021-07-25 VITALS — Wt 165.0 lb

## 2021-07-25 DIAGNOSIS — Z0000 Encounter for general adult medical examination without abnormal findings: Secondary | ICD-10-CM

## 2021-07-25 NOTE — Progress Notes (Signed)
Virtual Visit via Telephone Note  I connected with  Darlene Mcdaniel on 07/25/21 at 11:30 AM EDT by telephone and verified that I am speaking with the correct person using two identifiers.  Location: Patient: home Provider: [facility] Persons participating in the virtual visit: patient/Nurse Health Advisor   I discussed the limitations, risks, security and privacy concerns of performing an evaluation and management service by telephone and the availability of in person appointments. The patient expressed understanding and agreed to proceed.  Interactive audio and video telecommunications were attempted between this nurse and patient, however failed, due to patient having technical difficulties OR patient did not have access to video capability.  We continued and completed visit with audio only.  Some vital signs may be absent or patient reported.   Hal Hope, LPN  Subjective:   Darlene Mcdaniel is a 71 y.o. female who presents for Medicare Annual (Subsequent) preventive examination.  Review of Systems           Objective:    There were no vitals filed for this visit. There is no height or weight on file to calculate BMI.     08/01/2019    8:27 AM  Advanced Directives  Does Patient Have a Medical Advance Directive? No    Current Medications (verified) Outpatient Encounter Medications as of 07/25/2021  Medication Sig   acetaminophen (TYLENOL) 650 MG CR tablet Take 650 mg by mouth every 8 (eight) hours as needed for pain.   amLODipine (NORVASC) 10 MG tablet TAKE 1 TABLET(10 MG) BY MOUTH DAILY   aspirin EC 81 MG tablet Take 1 tablet (81 mg total) by mouth daily. Swallow whole.   atorvastatin (LIPITOR) 40 MG tablet TAKE 1 TABLET(40 MG) BY MOUTH DAILY   Blood Pressure Monitoring (BLOOD PRESSURE KIT) DEVI 1 Units by Does not apply route 2 (two) times daily.   cetirizine (ZYRTEC) 10 MG tablet Take 10 mg by mouth every other day.   diclofenac Sodium (VOLTAREN) 1 % GEL Apply 2 g  topically 4 (four) times daily.   losartan-hydrochlorothiazide (HYZAAR) 100-12.5 MG tablet TAKE 1 TABLET BY MOUTH DAILY   meloxicam (MOBIC) 7.5 MG tablet Take 1 tablet (7.5 mg total) by mouth 2 (two) times daily.   omeprazole (PRILOSEC) 20 MG capsule Take 1 capsule (20 mg total) by mouth daily.   oxybutynin (DITROPAN XL) 5 MG 24 hr tablet Take 1 tablet (5 mg total) by mouth at bedtime.   No facility-administered encounter medications on file as of 07/25/2021.    Allergies (verified) Patient has no known allergies.   History: Past Medical History:  Diagnosis Date   Hyperlipidemia    Hypertension    Past Surgical History:  Procedure Laterality Date   NO PAST SURGERIES     Family History  Problem Relation Age of Onset   Heart disease Mother    Stroke Mother    Diabetes Mother    Cancer Mother    Leukemia Mother    Breast cancer Sister    Social History   Socioeconomic History   Marital status: Single    Spouse name: Not on file   Number of children: 2   Years of education: Not on file   Highest education level: Not on file  Occupational History   Occupation: retired   Occupation: Retired  Tobacco Use   Smoking status: Every Day    Packs/day: 0.50    Years: 35.00    Total pack years: 17.50    Types: Cigarettes  Smokeless tobacco: Never  Vaping Use   Vaping Use: Never used  Substance and Sexual Activity   Alcohol use: Not Currently   Drug use: Never   Sexual activity: Yes    Partners: Male  Other Topics Concern   Not on file  Social History Narrative   Not on file   Social Determinants of Health   Financial Resource Strain: Low Risk  (04/03/2021)   Overall Financial Resource Strain (CARDIA)    Difficulty of Paying Living Expenses: Not hard at all  Food Insecurity: No Food Insecurity (04/03/2021)   Hunger Vital Sign    Worried About Running Out of Food in the Last Year: Never true    Ran Out of Food in the Last Year: Never true  Transportation Needs: No  Transportation Needs (04/03/2021)   PRAPARE - Administrator, Civil Service (Medical): No    Lack of Transportation (Non-Medical): No  Physical Activity: Insufficiently Active (07/25/2021)   Exercise Vital Sign    Days of Exercise per Week: 5 days    Minutes of Exercise per Session: 10 min  Stress: No Stress Concern Present (07/25/2021)   [employer] of Occupational Health - Occupational Stress Questionnaire    Feeling of Stress : Not at all  Social Connections: Moderately Integrated (04/03/2021)   Social Connection and Isolation Panel [NHANES]    Frequency of Communication with Friends and Family: More than three times a week    Frequency of Social Gatherings with Friends and Family: More than three times a week    Attends Religious Services: More than 4 times per year    Active Member of [employer] or Organizations: Yes    Attends Engineer, structural: More than 4 times per year    Marital Status: Separated    Tobacco Counseling Ready to quit: Not Answered Counseling given: Not Answered   Clinical Intake:  Pre-visit preparation completed: Yes  Pain : No/denies pain     Nutritional Risks: None Diabetes: Yes CBG done?: No Did pt. bring in CBG monitor from home?: No  How often do you need to have someone help you when you read instructions, pamphlets, or other written materials from your doctor or pharmacy?: 1 - Never  Diabetic?no  Interpreter Needed?: No  Information entered by :: Kennedy Bucker, LPN   Activities of Daily Living    06/19/2021   10:15 AM 04/03/2021    9:12 AM  In your present state of health, do you have any difficulty performing the following activities:  Hearing? 0 0  Vision? 0 1  Difficulty concentrating or making decisions? 0 0  Walking or climbing stairs? 1 1  Dressing or bathing? 0 0  Doing errands, shopping? 0 0    Patient Care Team: Lorre Munroe, NP as PCP - General (Internal Medicine)  Indicate any recent Medical  Services you may have received from other than [facility] providers in the past year (date may be approximate).     Assessment:   This is a routine wellness examination for Darlene Mcdaniel.  Hearing/Vision screen No results found.  Dietary issues and exercise activities discussed:     Goals Addressed             This Visit's Progress    DIET - EAT MORE FRUITS AND VEGETABLES         Depression Screen    07/25/2021   11:36 AM 06/19/2021   10:15 AM 04/03/2021    9:08 AM 03/26/2021  10:21 AM 03/10/2021    1:45 PM 02/24/2021   11:04 AM 02/13/2021   11:12 AM  PHQ 2/9 Scores  PHQ - 2 Score 0 0 0 0 0 0 0  PHQ- 9 Score 0 0 0 0 0 0 2    Fall Risk    06/19/2021   10:15 AM 03/26/2021   10:12 AM 03/10/2021    1:45 PM 02/24/2021   11:04 AM 06/18/2020    2:18 PM  Fall Risk   Falls in the past year? 0 0 0 0 0  Number falls in past yr: 0 0 0 0 0  Injury with Fall? 0 0 0 0 0  Risk for fall due to : No Fall Risks Orthopedic patient No Fall Risks Orthopedic patient No Fall Risks  Follow up Falls evaluation completed Falls evaluation completed Falls evaluation completed Falls evaluation completed Falls evaluation completed    FALL RISK PREVENTION PERTAINING TO THE HOME:  Any stairs in or around the home? No  If so, are there any without handrails? No  Home free of loose throw rugs in walkways, pet beds, electrical cords, etc? Yes  Adequate lighting in your home to reduce risk of falls? Yes   ASSISTIVE DEVICES UTILIZED TO PREVENT FALLS:  Life alert? No  Use of a cane, walker or w/c? Yes  Grab bars in the bathroom? Yes  Shower chair or bench in shower? No  Elevated toilet seat or a handicapped toilet? No   Cognitive Function:        04/03/2021    9:11 AM 08/01/2019    8:30 AM  6CIT Screen  What Year? 0 points 0 points  What month? 0 points 0 points  What time? 0 points 0 points  Count back from 20 0 points 0 points  Months in reverse 0 points 0 points  Repeat phrase 0 points 0 points   Total Score 0 points 0 points    Immunizations Immunization History  Administered Date(s) Administered   Influenza,inj,Quad PF,6+ Mos 11/29/2019   Influenza-Unspecified 11/29/2019   Moderna Sars-Covid-2 Vaccination 03/21/2019, 04/18/2019, 12/04/2019   Pneumococcal Conjugate-13 06/07/2019   Pneumococcal Polysaccharide-23 06/18/2020    TDAP status: Due, Education has been provided regarding the importance of this vaccine. Advised may receive this vaccine at local pharmacy or Health Dept. Aware to provide a copy of the vaccination record if obtained from local pharmacy or Health Dept. Verbalized acceptance and understanding.  Flu Vaccine status: Up to date  Pneumococcal vaccine status: Up to date  Covid-19 vaccine status: Completed vaccines  Qualifies for Shingles Vaccine? Yes   Zostavax completed No   Shingrix Completed?: No.    Education has been provided regarding the importance of this vaccine. Patient has been advised to call insurance company to determine out of pocket expense if they have not yet received this vaccine. Advised may also receive vaccine at local pharmacy or Health Dept. Verbalized acceptance and understanding.  Screening Tests Health Maintenance  Topic Date Due   Zoster Vaccines- Shingrix (1 of 2) Never done   DEXA SCAN  Never done   COVID-19 Vaccine (4 - Moderna series) 10/03/2021 (Originally 01/29/2020)   TETANUS/TDAP  06/20/2022 (Originally 06/15/1969)   INFLUENZA VACCINE  09/02/2021   Fecal DNA (Cologuard)  06/20/2022   MAMMOGRAM  01/01/2023   Pneumonia Vaccine 42+ Years old  Completed   Hepatitis C Screening  Completed   HPV VACCINES  Aged Out    Health Maintenance  Health Maintenance Due  Topic  Date Due   Zoster Vaccines- Shingrix (1 of 2) Never done   DEXA SCAN  Never done    Colorectal cancer screening: Type of screening: Cologuard. Completed 06/20/19. Repeat every 3 years  Mammogram status: Completed 12/31/20. Repeat every year-  ordered  Bone Density status: Ordered 06/19/21. Pt provided with contact info and advised to call to schedule appt.  Lung Cancer Screening: (Low Dose CT Chest recommended if Age 9-80 years, 30 pack-year currently smoking OR have quit w/in 15years.) does qualify.   Additional Screening:  Hepatitis C Screening: does qualify; Completed 06/19/20  Vision Screening: Recommended annual ophthalmology exams for early detection of glaucoma and other disorders of the eye. Is the patient up to date with their annual eye exam?  Yes  Who is the provider or what is the name of the office in which the patient attends annual eye exams? Dr.Shade If pt is not established with a provider, would they like to be referred to a provider to establish care? No .   Dental Screening: Recommended annual dental exams for proper oral hygiene  Community Resource Referral / Chronic Care Management: CRR required this visit?  No   CCM required this visit?  No      Plan:     I have personally reviewed and noted the following in the patient's chart:   Medical and social history Use of alcohol, tobacco or illicit drugs  Current medications and supplements including opioid prescriptions.  Functional ability and status Nutritional status Physical activity Advanced directives List of other physicians Hospitalizations, surgeries, and ER visits in previous 12 months Vitals Screenings to include cognitive, depression, and falls Referrals and appointments  In addition, I have reviewed and discussed with patient certain preventive protocols, quality metrics, and best practice recommendations. A written personalized care plan for preventive services as well as general preventive health recommendations were provided to patient.     Hal Hope, LPN   03/04/8655   Nurse Notes: none

## 2021-08-07 ENCOUNTER — Other Ambulatory Visit: Payer: Self-pay | Admitting: Internal Medicine

## 2021-08-07 DIAGNOSIS — I10 Essential (primary) hypertension: Secondary | ICD-10-CM

## 2021-08-07 NOTE — Telephone Encounter (Signed)
Requested Prescriptions  Pending Prescriptions Disp Refills  . losartan-hydrochlorothiazide (HYZAAR) 100-12.5 MG tablet [Pharmacy Med Name: LOSARTAN/HCTZ 100/12.5MG TABLETS] 90 tablet 0    Sig: TAKE 1 TABLET BY MOUTH DAILY     Cardiovascular: ARB + Diuretic Combos Failed - 08/07/2021  6:02 AM      Failed - Cr in normal range and within 180 days    Creat  Date Value Ref Range Status  06/19/2021 1.11 (H) 0.50 - 1.05 mg/dL Final         Passed - K in normal range and within 180 days    Potassium  Date Value Ref Range Status  06/19/2021 4.3 3.5 - 5.3 mmol/L Final         Passed - Na in normal range and within 180 days    Sodium  Date Value Ref Range Status  06/19/2021 141 135 - 146 mmol/L Final         Passed - eGFR is 10 or above and within 180 days    GFR, Est African American  Date Value Ref Range Status  05/23/2019 113 > OR = 60 mL/min/1.1m Final   GFR, Est Non African American  Date Value Ref Range Status  05/23/2019 98 > OR = 60 mL/min/1.729mFinal   eGFR  Date Value Ref Range Status  06/19/2021 54 (L) > OR = 60 mL/min/1.7326minal    Comment:    The eGFR is based on the CKD-EPI 2021 equation. To calculate  the new eGFR from a previous Creatinine or Cystatin C result, go to https://www.kidney.org/professionals/ kdoqi/gfr%5Fcalculator          Passed - Patient is not pregnant      Passed - Last BP in normal range    BP Readings from Last 1 Encounters:  06/19/21 110/64         Passed - Valid encounter within last 6 months    Recent Outpatient Visits          1 month ago Encounter for general adult medical examination with abnormal findings   [facility]   4 months ago Primary osteoarthritis of left knee   [facility]   5 months ago Mixed incontinence   [facility]   5 months ago Primary osteoarthritis of left knee   [facility]   5 months ago Chronic pain of left knee   [facility]      Future Appointments            [facility]

## 2021-08-18 ENCOUNTER — Other Ambulatory Visit: Payer: Self-pay

## 2021-08-18 DIAGNOSIS — Z87891 Personal history of nicotine dependence: Secondary | ICD-10-CM

## 2021-08-18 DIAGNOSIS — F17210 Nicotine dependence, cigarettes, uncomplicated: Secondary | ICD-10-CM

## 2021-08-18 DIAGNOSIS — Z122 Encounter for screening for malignant neoplasm of respiratory organs: Secondary | ICD-10-CM

## 2021-08-28 ENCOUNTER — Ambulatory Visit
Admission: RE | Admit: 2021-08-28 | Discharge: 2021-08-28 | Disposition: A | Discharge status: Home / Self Care | Payer: Medicare Other | Source: Ambulatory Visit | Attending: Acute Care | Admitting: Acute Care

## 2021-08-28 DIAGNOSIS — Z122 Encounter for screening for malignant neoplasm of respiratory organs: Secondary | ICD-10-CM | POA: Insufficient documentation

## 2021-08-28 DIAGNOSIS — J439 Emphysema, unspecified: Secondary | ICD-10-CM | POA: Diagnosis not present

## 2021-08-28 DIAGNOSIS — Z87891 Personal history of nicotine dependence: Secondary | ICD-10-CM

## 2021-08-28 DIAGNOSIS — I700 Atherosclerosis of aorta: Secondary | ICD-10-CM | POA: Insufficient documentation

## 2021-08-28 DIAGNOSIS — F17210 Nicotine dependence, cigarettes, uncomplicated: Secondary | ICD-10-CM | POA: Diagnosis not present

## 2021-08-29 ENCOUNTER — Other Ambulatory Visit: Payer: Self-pay

## 2021-08-29 DIAGNOSIS — F17210 Nicotine dependence, cigarettes, uncomplicated: Secondary | ICD-10-CM

## 2021-08-29 DIAGNOSIS — Z87891 Personal history of nicotine dependence: Secondary | ICD-10-CM

## 2021-08-29 DIAGNOSIS — Z122 Encounter for screening for malignant neoplasm of respiratory organs: Secondary | ICD-10-CM

## 2021-09-14 ENCOUNTER — Other Ambulatory Visit: Payer: Self-pay | Admitting: Internal Medicine

## 2021-09-14 DIAGNOSIS — Z0001 Encounter for general adult medical examination with abnormal findings: Secondary | ICD-10-CM

## 2021-09-15 NOTE — Telephone Encounter (Signed)
Requested medication (s) are due for refill today: yes  Requested medication (s) are on the active medication list: yes  Last refill:  06/19/21 #30 2 refills  Future visit scheduled: yes in 1 week  Notes to clinic:  do you want to continue refills?     Requested Prescriptions  Pending Prescriptions Disp Refills   oxybutynin (DITROPAN-XL) 5 MG 24 hr tablet [Pharmacy Med Name: OXYBUTYNIN ER 5MG  TABLETS] 30 tablet 2    Sig: TAKE 1 TABLET(5 MG) BY MOUTH AT BEDTIME     Urology:  Bladder Agents Passed - 09/14/2021  9:34 AM      Passed - Valid encounter within last 12 months    Recent Outpatient Visits           2 months ago Encounter for general adult medical examination with abnormal findings   [facility], Mullins, NP   5 months ago Primary osteoarthritis of left knee   [facility], MD   6 months ago Mixed incontinence   [facility] [place], Mullins, NP   6 months ago Primary osteoarthritis of left knee   [facility], MD   7 months ago Chronic pain of left knee   [facility] [place], Mullins, NP       Future Appointments             In 1 week Salvad[facility] Oxford, Sampson Si, NP [facility]

## 2021-09-24 ENCOUNTER — Encounter: Payer: Self-pay | Admitting: Internal Medicine

## 2021-09-24 ENCOUNTER — Ambulatory Visit (INDEPENDENT_AMBULATORY_CARE_PROVIDER_SITE_OTHER): Payer: Medicare Other | Admitting: Internal Medicine

## 2021-09-24 ENCOUNTER — Other Ambulatory Visit: Payer: Self-pay | Admitting: Internal Medicine

## 2021-09-24 VITALS — BP 118/82 | HR 69 | Temp 97.3°F | Wt 168.0 lb

## 2021-09-24 DIAGNOSIS — I700 Atherosclerosis of aorta: Secondary | ICD-10-CM

## 2021-09-24 DIAGNOSIS — I10 Essential (primary) hypertension: Secondary | ICD-10-CM

## 2021-09-24 DIAGNOSIS — N1831 Chronic kidney disease, stage 3a: Secondary | ICD-10-CM

## 2021-09-24 DIAGNOSIS — E782 Mixed hyperlipidemia: Secondary | ICD-10-CM | POA: Diagnosis not present

## 2021-09-24 DIAGNOSIS — M1712 Unilateral primary osteoarthritis, left knee: Secondary | ICD-10-CM | POA: Diagnosis not present

## 2021-09-24 DIAGNOSIS — E1122 Type 2 diabetes mellitus with diabetic chronic kidney disease: Secondary | ICD-10-CM

## 2021-09-24 DIAGNOSIS — N183 Chronic kidney disease, stage 3 unspecified: Secondary | ICD-10-CM | POA: Insufficient documentation

## 2021-09-24 DIAGNOSIS — K219 Gastro-esophageal reflux disease without esophagitis: Secondary | ICD-10-CM

## 2021-09-24 DIAGNOSIS — E119 Type 2 diabetes mellitus without complications: Secondary | ICD-10-CM | POA: Insufficient documentation

## 2021-09-24 DIAGNOSIS — G4733 Obstructive sleep apnea (adult) (pediatric): Secondary | ICD-10-CM

## 2021-09-24 DIAGNOSIS — Z6832 Body mass index (BMI) 32.0-32.9, adult: Secondary | ICD-10-CM

## 2021-09-24 DIAGNOSIS — N3946 Mixed incontinence: Secondary | ICD-10-CM

## 2021-09-24 DIAGNOSIS — J439 Emphysema, unspecified: Secondary | ICD-10-CM | POA: Diagnosis not present

## 2021-09-24 DIAGNOSIS — E6609 Other obesity due to excess calories: Secondary | ICD-10-CM

## 2021-09-24 LAB — POCT GLYCOSYLATED HEMOGLOBIN (HGB A1C): HbA1c, POC (controlled diabetic range): 6.8 % (ref 0.0–7.0)

## 2021-09-24 MED ORDER — METFORMIN HCL 500 MG PO TABS: 500.0000 mg | ORAL_TABLET | Freq: Every day | ORAL | 0 refills | Status: DC

## 2021-09-24 NOTE — Assessment & Plan Note (Signed)
Continue Ditropan 

## 2021-09-24 NOTE — Assessment & Plan Note (Signed)
Encourage weight loss as this can help reduce reflux symptoms Continue omeprazole 

## 2021-09-24 NOTE — Assessment & Plan Note (Signed)
Encourage diet and exercise for weight loss 

## 2021-09-24 NOTE — Patient Instructions (Signed)

## 2021-09-24 NOTE — Progress Notes (Signed)
Subjective:    Patient ID: Darlene Mcdaniel, female    DOB: July 03, 1950, 71 y.o.   MRN: [id number]  HPI  Patient presents to clinic today for 19-month follow-up of chronic conditions.  HTN: Her BP today is 118/82.  She is taking Amlodipine, Losartan HCT as prescribed.  There is no ECG on file.  HLD with Aortic Atherosclerosis: Her last LDL was 79, triglycerides 208, 06/2021.  She denies myalgias on Atorvastatin.  She is taking Aspirin as well.  She tries to consume low-fat diet.  OSA: She averages 4 to 5 hours of sleep per night without use of her CPAP.  She reports she currently does not have a CPAP machine.  Sleep study from 07/2019 reviewed.  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  She takes Meloxicam and uses Voltaren gel as needed with good relief of symptoms.  Prediabetes: Her last A1c was 6.4%, 06/2021.  She is not taking any oral diabetic medication at this time.  She does not check her sugars.  GERD: She is not sure what triggers this. She denies breakthrough on Omeprazole.  There is no upper GI on file.  OAB: She works mainly urinary frequency and urgency.  She is taking Ditropan as prescribed.  She does not follow with urology.  CKD 3: Her last creatinine was 1.11, GFR 54, 06/2021.  She is on Losartan for renal protection.  She does not follow with nephrology.  COPD: She denies chronic cough or shortness of breath.  She is not using inhalers at this time.  She does continue to smoke.  Review of Systems     Past Medical History:  Diagnosis Date   Hyperlipidemia    Hypertension     Current Outpatient Medications  Medication Sig Dispense Refill   acetaminophen (TYLENOL) 650 MG CR tablet Take 650 mg by mouth every 8 (eight) hours as needed for pain.     amLODipine (NORVASC) 10 MG tablet TAKE 1 TABLET(10 MG) BY MOUTH DAILY 90 tablet 0   aspirin EC 81 MG tablet Take 1 tablet (81 mg total) by mouth daily. Swallow whole. 90 tablet 1   atorvastatin (LIPITOR) 40 MG tablet TAKE 1 TABLET(40 MG) BY  MOUTH DAILY 90 tablet 1   Blood Pressure Monitoring (BLOOD PRESSURE KIT) DEVI 1 Units by Does not apply route 2 (two) times daily. 1 each 0   cetirizine (ZYRTEC) 10 MG tablet Take 10 mg by mouth every other day.     diclofenac Sodium (VOLTAREN) 1 % GEL Apply 2 g topically 4 (four) times daily. 50 g 1   losartan-hydrochlorothiazide (HYZAAR) 100-12.5 MG tablet TAKE 1 TABLET BY MOUTH DAILY 90 tablet 0   meloxicam (MOBIC) 7.5 MG tablet Take 1 tablet (7.5 mg total) by mouth 2 (two) times daily. 180 tablet 1   omeprazole (PRILOSEC) 20 MG capsule Take 1 capsule (20 mg total) by mouth daily. 90 capsule 1   oxybutynin (DITROPAN-XL) 5 MG 24 hr tablet TAKE 1 TABLET(5 MG) BY MOUTH AT BEDTIME 90 tablet 0   No current facility-administered medications for this visit.    No Known Allergies  Family History  Problem Relation Age of Onset   Heart disease Mother    Stroke Mother    Diabetes Mother    Cancer Mother    Leukemia Mother    Breast cancer Sister     Social History   Socioeconomic History   Marital status: Single    Spouse name: Not on file   Number of children:  2   Years of education: Not on file   Highest education level: Not on file  Occupational History   Occupation: retired   Occupation: Retired  Tobacco Use   Smoking status: Every Day    Packs/day: 0.50    Years: 35.00    Total pack years: 17.50    Types: Cigarettes   Smokeless tobacco: Never  Vaping Use   Vaping Use: Never used  Substance and Sexual Activity   Alcohol use: Not Currently   Drug use: Never   Sexual activity: Yes    Partners: Male  Other Topics Concern   Not on file  Social History Narrative   Not on file   Social Determinants of Health   Financial Resource Strain: Low Risk  (07/25/2021)   Overall Financial Resource Strain (CARDIA)    Difficulty of Paying Living Expenses: Not hard at all  Food Insecurity: No Food Insecurity (07/25/2021)   Hunger Vital Sign    Worried About Running Out of Food in  the Last Year: Never true    [place] in the Last Year: Never true  Transportation Needs: No Transportation Needs (07/25/2021)   PRAPARE - Hydrologist (Medical): No    Lack of Transportation (Non-Medical): No  Physical Activity: Insufficiently Active (07/25/2021)   Exercise Vital Sign    Days of Exercise per Week: 5 days    Minutes of Exercise per Session: 10 min  Stress: No Stress Concern Present (07/25/2021)   [place]    Feeling of Stress : Not at all  Social Connections: Moderately Isolated (07/25/2021)   Social Connection and Isolation Panel [NHANES]    Frequency of Communication with Friends and Family: More than three times a week    Frequency of Social Gatherings with Friends and Family: Once a week    Attends Religious Services: More than 4 times per year    Active Member of [employer] or Organizations: No    Attends Archivist Meetings: Never    Marital Status: Separated  Intimate Partner Violence: Not At Risk (07/25/2021)   Humiliation, Afraid, Rape, and Kick questionnaire    Fear of Current or Ex-Partner: No    Emotionally Abused: No    Physically Abused: No    Sexually Abused: No     Constitutional: Denies fever, malaise, fatigue, headache or abrupt weight changes.  HEENT: Denies eye pain, eye redness, ear pain, ringing in the ears, wax buildup, runny nose, nasal congestion, bloody nose, or sore throat. Respiratory: Denies difficulty breathing, shortness of breath, cough or sputum production.   Cardiovascular: Denies chest pain, chest tightness, palpitations or swelling in the hands or feet.  Gastrointestinal: Denies abdominal pain, bloating, constipation, diarrhea or blood in the stool.  GU: Denies urgency, frequency, pain with urination, burning sensation, blood in urine, odor or discharge. Musculoskeletal: Reports intermittent joint pain.  Denies decrease in  range of motion, difficulty with gait, muscle pain or joint swelling.  Skin: Denies redness, rashes, lesions or ulcercations.  Neurological: Denies dizziness, difficulty with memory, difficulty with speech or problems with balance and coordination.  Psych: Denies anxiety, depression, SI/HI.  No other specific complaints in a complete review of systems (except as listed in HPI above).  Objective:   Physical Exam  BP 118/82 (BP Location: Left Arm, Patient Position: Sitting, Cuff Size: Normal)   Pulse 69   Temp (!) 97.3 F (36.3 C) (Temporal)   Wt  168 lb (76.2 kg)   SpO2 100%   BMI 32.81 kg/m   Wt Readings from Last 3 Encounters:  07/25/21 165 lb (74.8 kg)  06/19/21 165 lb (74.8 kg)  03/26/21 162 lb (73.5 kg)    General: Appears her stated age, obese, in NAD. Skin: Warm, dry and intact. No ulcerations noted. HEENT: Head: normal shape and size; Eyes: sclera white, no icterus, conjunctiva pink, PERRLA and EOMs intact;  Cardiovascular: Normal rate and rhythm. S1,S2 noted.  No murmur, rubs or gallops noted. No JVD or BLE edema. No carotid bruits noted. Pulmonary/Chest: Normal effort and positive vesicular breath sounds. No respiratory distress. No wheezes, rales or ronchi noted.  Abdomen:  Normal bowel sounds. Musculoskeletal: No difficulty with gait.  Neurological: Alert and oriented.    BMET    Component Value Date/Time   NA 141 06/19/2021 0942   K 4.3 06/19/2021 0942   CL 106 06/19/2021 0942   CO2 27 06/19/2021 0942   GLUCOSE 83 06/19/2021 0942   BUN 29 (H) 06/19/2021 0942   CREATININE 1.11 (H) 06/19/2021 0942   CALCIUM 8.8 06/19/2021 0942   GFRNONAA 98 05/23/2019 1110   GFRAA 113 05/23/2019 1110    Lipid Panel     Component Value Date/Time   CHOL 154 06/19/2021 0942   TRIG 208 (H) 06/19/2021 0942   HDL 45 (L) 06/19/2021 0942   CHOLHDL 3.4 06/19/2021 0942   LDLCALC 79 06/19/2021 0942    CBC    Component Value Date/Time   WBC 8.1 06/19/2021 0942   RBC 3.87  06/19/2021 0942   HGB 11.9 06/19/2021 0942   HCT 36.1 06/19/2021 0942   PLT 352 06/19/2021 0942   MCV 93.3 06/19/2021 0942   MCH 30.7 06/19/2021 0942   MCHC 33.0 06/19/2021 0942   RDW 13.0 06/19/2021 0942   LYMPHSABS 2,133 05/23/2019 1110   EOSABS 52 05/23/2019 1110   BASOSABS 52 05/23/2019 1110    Hgb A1C Lab Results  Component Value Date   HGBA1C 6.4 (H) 06/19/2021            Assessment & Plan:     RTC in 3 months, follow-up chronic conditions Webb Silversmith, NP

## 2021-09-24 NOTE — Assessment & Plan Note (Signed)
C-Met today Continue losartan for renal protection

## 2021-09-24 NOTE — Telephone Encounter (Signed)
Requested Prescriptions  Pending Prescriptions Disp Refills  . amLODipine (NORVASC) 10 MG tablet [Pharmacy Med Name: AMLODIPINE BESYLATE 10MG  TABLETS] 90 tablet 1    Sig: TAKE 1 TABLET(10 MG) BY MOUTH DAILY     Cardiovascular: Calcium Channel Blockers 2 Passed - 09/24/2021 12:45 PM      Passed - Last BP in normal range    BP Readings from Last 1 Encounters:  09/24/21 118/82         Passed - Last Heart Rate in normal range    Pulse Readings from Last 1 Encounters:  09/24/21 69         Passed - Valid encounter within last 6 months    Recent Outpatient Visits          Today Type 2 diabetes mellitus with stage 3a chronic kidney disease, without long-term current use of insulin (HCC)   [facility] [place], Mullins, NP   3 months ago Encounter for general adult medical examination with abnormal findings   [facility] [place], Mullins, NP   6 months ago Primary osteoarthritis of left knee   [facility] at [facility], [facility], MD   6 months ago Mixed incontinence   [facility] [place], Mullins, NP   7 months ago Primary osteoarthritis of left knee   [facility] Primary Care and Sports Medicine at [facility], [facility], MD      Future Appointments            In 3 months Baity, Ocie Bob, NP [facility], [facility]

## 2021-09-24 NOTE — Assessment & Plan Note (Signed)
C-Met and lipid profile today Encouraged her to consume low-fat diet Continue atorvastatin and aspirin 

## 2021-09-24 NOTE — Assessment & Plan Note (Signed)
Controlled on amlodipine and losartan HCT Reinforced DASH diet and exercise for weight loss C-Met today

## 2021-09-24 NOTE — Assessment & Plan Note (Signed)
Asymptomatic and not using inhalers

## 2021-09-24 NOTE — Assessment & Plan Note (Signed)
POCT A1c 6.8% We will check urine microalbumin Discussed diabetes and standards of medical care We will start metformin 500 mg daily Encourage low-carb diet and exercise weight loss Encourage routine eye exam Encourage routine foot exam

## 2021-09-24 NOTE — Assessment & Plan Note (Signed)
C-Met and lipid profile today Encouraged her to consume low-fat diet Continue atorvastatin 

## 2021-09-24 NOTE — Assessment & Plan Note (Signed)
Encourage weight loss as this can produce sleep apnea symptoms She is not wearing CPAP

## 2021-09-24 NOTE — Assessment & Plan Note (Signed)
Continue meloxicam and Voltaren gel We will consider stopping meloxicam if kidney function remains elevated

## 2021-09-25 LAB — COMPLETE METABOLIC PANEL WITH GFR
AG Ratio: 1.2 (calc) (ref 1.0–2.5)
ALT: 17 U/L (ref 6–29)
AST: 12 U/L (ref 10–35)
Albumin: 3.8 g/dL (ref 3.6–5.1)
Alkaline phosphatase (APISO): 74 U/L (ref 37–153)
BUN: 20 mg/dL (ref 7–25)
CO2: 24 mmol/L (ref 20–32)
Calcium: 8.9 mg/dL (ref 8.6–10.4)
Chloride: 110 mmol/L (ref 98–110)
Creat: 0.87 mg/dL (ref 0.50–1.05)
Globulin: 3.1 g/dL (calc) (ref 1.9–3.7)
Glucose, Bld: 134 mg/dL — ABNORMAL HIGH (ref 65–99)
Potassium: 4.1 mmol/L (ref 3.5–5.3)
Sodium: 142 mmol/L (ref 135–146)
Total Bilirubin: 0.4 mg/dL (ref 0.2–1.2)
Total Protein: 6.9 g/dL (ref 6.1–8.1)
eGFR: 72 mL/min/{1.73_m2} (ref >=60)

## 2021-09-25 LAB — LIPID PANEL
Cholesterol: 162 mg/dL (ref <200)
HDL: 46 mg/dL — ABNORMAL LOW (ref >=50)
LDL Cholesterol (Calc): 84 mg/dL (calc)
Non-HDL Cholesterol (Calc): 116 mg/dL (calc) (ref <130)
Total CHOL/HDL Ratio: 3.5 (calc) (ref <5.0)
Triglycerides: 229 mg/dL — ABNORMAL HIGH (ref <150)

## 2021-09-25 LAB — MICROALBUMIN / CREATININE URINE RATIO
Creatinine, Urine: 80 mg/dL (ref 20–275)
Microalb, Ur: <0.2 mg/dL

## 2021-10-16 NOTE — Progress Notes (Signed)
Medicare wellness performed by CMA.. Note and chart reviewed. Nicki Reaper, NP

## 2021-11-06 ENCOUNTER — Other Ambulatory Visit: Payer: Self-pay | Admitting: Internal Medicine

## 2021-11-06 DIAGNOSIS — I10 Essential (primary) hypertension: Secondary | ICD-10-CM

## 2021-11-06 NOTE — Telephone Encounter (Signed)
Requested Prescriptions  Pending Prescriptions Disp Refills  . losartan-hydrochlorothiazide (HYZAAR) 100-12.5 MG tablet [Pharmacy Med Name: LOSARTAN/HCTZ 100/12.5MG  TABLETS] 90 tablet 0    Sig: TAKE 1 TABLET BY MOUTH DAILY     Cardiovascular: ARB + Diuretic Combos Passed - 11/06/2021  3:14 AM      Passed - K in normal range and within 180 days    Potassium  Date Value Ref Range Status  09/24/2021 4.1 3.5 - 5.3 mmol/L Final         Passed - Na in normal range and within 180 days    Sodium  Date Value Ref Range Status  09/24/2021 142 135 - 146 mmol/L Final         Passed - Cr in normal range and within 180 days    Creat  Date Value Ref Range Status  09/24/2021 0.87 0.50 - 1.05 mg/dL Final   Creatinine, Urine  Date Value Ref Range Status  09/24/2021 80 20 - 275 mg/dL Final         Passed - eGFR is 10 or above and within 180 days    GFR, Est African American  Date Value Ref Range Status  05/23/2019 113 > OR = 60 mL/min/1.55m2 Final   GFR, Est Non African American  Date Value Ref Range Status  05/23/2019 98 > OR = 60 mL/min/1.3m2 Final   eGFR  Date Value Ref Range Status  09/24/2021 72 > OR = 60 mL/min/1.20m2 Final         Passed - Patient is not pregnant      Passed - Last BP in normal range    BP Readings from Last 1 Encounters:  09/24/21 118/82         Passed - Valid encounter within last 6 months    Recent Outpatient Visits          1 month ago Type 2 diabetes mellitus with stage 3a chronic kidney disease, without long-term current use of insulin ([place])   [facility] [place], Coralie Keens, NP   4 months ago Encounter for general adult medical examination with abnormal findings   [facility] [place], Coralie Keens, NP   7 months ago Primary osteoarthritis of left knee   [facility] Primary Care and Sports Medicine at [facility], Earley Abide, MD   8 months ago Mixed incontinence   [place], Coralie Keens, NP    8 months ago Primary osteoarthritis of left knee   [facility] Primary [facility] and [facility] at [facility], Earley Abide, MD      Future Appointments            In 1 month [place], Coralie Keens, NP [facility], [facility]

## 2021-11-29 ENCOUNTER — Other Ambulatory Visit: Payer: Self-pay | Admitting: Internal Medicine

## 2021-11-29 DIAGNOSIS — Z0001 Encounter for general adult medical examination with abnormal findings: Secondary | ICD-10-CM

## 2021-11-29 DIAGNOSIS — E782 Mixed hyperlipidemia: Secondary | ICD-10-CM

## 2021-12-01 NOTE — Telephone Encounter (Signed)
Requested Prescriptions  Pending Prescriptions Disp Refills  . omeprazole (PRILOSEC) 20 MG capsule [Pharmacy Med Name: OMEPRAZOLE 20MG  CAPSULES] 90 capsule 3    Sig: TAKE 1 CAPSULE(20 MG) BY MOUTH DAILY     Gastroenterology: Proton Pump Inhibitors Passed - 11/29/2021  3:18 AM      Passed - Valid encounter within last 12 months    Recent Outpatient Visits          2 months ago Type 2 diabetes mellitus with stage 3a chronic kidney disease, without long-term current use of insulin ([place])   [facility], Coralie Keens, NP   5 months ago Encounter for general adult medical examination with abnormal findings   [facility] [place], Coralie Keens, NP   8 months ago Primary osteoarthritis of left knee   [facility] Primary Care and Sports Medicine at [facility], Earley Abide, MD   8 months ago Mixed incontinence   [place], Coralie Keens, NP   9 months ago Primary osteoarthritis of left knee   [facility] Primary Care and Sports Medicine at [facility], Earley Abide, MD      Future Appointments            In 4 weeks Baity, Coralie Keens, NP [facility], [place]           . atorvastatin (LIPITOR) 40 MG tablet [Pharmacy Med Name: ATORVASTATIN 40MG  TABLETS] 90 tablet 3    Sig: TAKE 1 TABLET(40 MG) BY MOUTH DAILY     Cardiovascular:  Antilipid - Statins Failed - 11/29/2021  3:18 AM      Failed - Lipid Panel in normal range within the last 12 months    Cholesterol  Date Value Ref Range Status  09/24/2021 162 <200 mg/dL Final   LDL Cholesterol (Calc)  Date Value Ref Range Status  09/24/2021 84 mg/dL (calc) Final    Comment:    Reference range: <100 . Desirable range <100 mg/dL for primary prevention;   <70 mg/dL for patients with CHD or diabetic patients  with > or = 2 CHD risk factors. Marland Kitchen LDL-C is now calculated using the Martin-Hopkins  calculation, which is a validated novel method providing  better accuracy  than the Friedewald equation in the  estimation of LDL-C.  Cresenciano Genre et al. Annamaria Helling. 4401;027(25): 2061-2068  (http://education.Quest[employer].com/faq/FAQ164)    HDL  Date Value Ref Range Status  09/24/2021 46 (L) > OR = 50 mg/dL Final   Triglycerides  Date Value Ref Range Status  09/24/2021 229 (H) <150 mg/dL Final    Comment:    . If a non-fasting specimen was collected, consider repeat triglyceride testing on a fasting specimen if clinically indicated.  Yates Decamp et al. J. of Clin. Lipidol. 3664;4:034-742. Marland Kitchen          Passed - Patient is not pregnant      Passed - Valid encounter within last 12 months    Recent Outpatient Visits          2 months ago Type 2 diabetes mellitus with stage 3a chronic kidney disease, without long-term current use of insulin ([address])   [facility] [place], Coralie Keens, NP   5 months ago Encounter for general adult medical examination with abnormal findings   [facility] [place], Coralie Keens, NP   8 months ago Primary osteoarthritis of left knee   [facility] Primary Care and Sports Medicine at [facility],  Ocie Bob, MD   8 months ago Mixed incontinence   [facility] [place], Salvadore [place], NP   9 months ago Primary osteoarthritis of left knee   [facility] Primary Care and Sports Medicine at [facility], Ocie Bob, MD      Future Appointments            In 4 weeks Baity, Salvadore [place], NP [facility], [facility]

## 2021-12-20 ENCOUNTER — Other Ambulatory Visit: Payer: Self-pay | Admitting: Internal Medicine

## 2021-12-20 DIAGNOSIS — N1831 Chronic kidney disease, stage 3a: Secondary | ICD-10-CM

## 2021-12-20 DIAGNOSIS — Z0001 Encounter for general adult medical examination with abnormal findings: Secondary | ICD-10-CM

## 2021-12-22 NOTE — Telephone Encounter (Signed)
Requested Prescriptions  Pending Prescriptions Disp Refills   metFORMIN (GLUCOPHAGE) 500 MG tablet [Pharmacy Med Name: METFORMIN 500MG TABLETS] 90 tablet 1    Sig: TAKE 1 TABLET(500 MG) BY MOUTH DAILY WITH BREAKFAST     Endocrinology:  Diabetes - Biguanides Failed - 12/20/2021  3:16 AM      Failed - B12 Level in normal range and within 720 days    No results found for: "VITAMINB12"       Failed - CBC within normal limits and completed in the last 12 months    WBC  Date Value Ref Range Status  06/19/2021 8.1 3.8 - 10.8 Thousand/uL Final   RBC  Date Value Ref Range Status  06/19/2021 3.87 3.80 - 5.10 Million/uL Final   Hemoglobin  Date Value Ref Range Status  06/19/2021 11.9 11.7 - 15.5 g/dL Final   HCT  Date Value Ref Range Status  06/19/2021 36.1 35.0 - 45.0 % Final   MCHC  Date Value Ref Range Status  06/19/2021 33.0 32.0 - 36.0 g/dL Final   [facility]  Date Value Ref Range Status  06/19/2021 30.7 27.0 - 33.0 pg Final   MCV  Date Value Ref Range Status  06/19/2021 93.3 80.0 - 100.0 fL Final   No results found for: "PLTCOUNTKUC", "LABPLAT", "POCPLA" RDW  Date Value Ref Range Status  06/19/2021 13.0 11.0 - 15.0 % Final         Passed - Cr in normal range and within 360 days    Creat  Date Value Ref Range Status  09/24/2021 0.87 0.50 - 1.05 mg/dL Final   Creatinine, Urine  Date Value Ref Range Status  09/24/2021 80 20 - 275 mg/dL Final         Passed - HBA1C is between 0 and 7.9 and within 180 days    HbA1c, POC (controlled diabetic range)  Date Value Ref Range Status  09/24/2021 6.8 0.0 - 7.0 % Final         Passed - eGFR in normal range and within 360 days    GFR, Est African American  Date Value Ref Range Status  05/23/2019 113 > OR = 60 mL/min/1.28m Final   GFR, Est Non African American  Date Value Ref Range Status  05/23/2019 98 > OR = 60 mL/min/1.735mFinal   eGFR  Date Value Ref Range Status  09/24/2021 72 > OR = 60 mL/min/1.7317minal          Passed - Valid encounter within last 6 months    Recent Outpatient Visits           2 months ago Type 2 diabetes mellitus with stage 3a chronic kidney disease, without long-term current use of insulin ([place]CC[place] [facility]   6 months ago Encounter for general adult medical examination with abnormal findings   [facility]   9 months ago Primary osteoarthritis of left knee   [facility]   9 months ago Mixed incontinence   [facility]   10 months ago Primary osteoarthritis of left knee   [facility]       Future Appointments             In 1 week Baity, RegCoralie KeensP [facility]             meloxicam (MOBIC) 7.5 MG tablet [Pharmacy Med Name: MELOXICAM 7.5MG TABLETS] 180 tablet 1    Sig: TAKE 1 TABLET(7.5 MG) BY MOUTH TWICE DAILY     Analgesics:  COX2 Inhibitors Failed - 12/20/2021  3:16 AM      Failed - Manual Review: Labs are only required if the patient has taken medication for more than 8 weeks.      Passed - HGB in normal range and within 360 days    Hemoglobin  Date Value Ref Range Status  06/19/2021 11.9 11.7 - 15.5 g/dL Final         Passed - Cr in normal range and within 360 days    Creat  Date Value Ref Range Status  09/24/2021 0.87 0.50 - 1.05 mg/dL Final   Creatinine, Urine  Date Value Ref Range Status  09/24/2021 80 20 - 275 mg/dL Final         Passed - HCT in normal range and within 360 days    HCT  Date Value Ref Range Status  06/19/2021 36.1 35.0 - 45.0 % Final         Passed - AST in normal range and within 360 days    AST  Date Value Ref Range Status  09/24/2021 12 10 - 35 U/L Final         Passed - ALT in normal range and within 360 days    ALT  Date Value Ref Range  Status  09/24/2021 17 6 - 29 U/L Final         Passed - eGFR is 30 or above and within 360 days    GFR, Est African American  Date Value Ref Range Status  05/23/2019 113 > OR = 60 mL/min/1.17m Final   GFR, Est Non African American  Date Value Ref Range Status  05/23/2019 98 > OR = 60 mL/min/1.775mFinal   eGFR  Date Value Ref Range Status  09/24/2021 72 > OR = 60 mL/min/1.7367minal         Passed - Patient is not pregnant      Passed - Valid encounter within last 12 months    Recent Outpatient Visits           2 months ago Type 2 diabetes mellitus with stage 3a chronic kidney disease, without long-term current use of insulin ([place]CC[place] [facility]oralie KeensP   6 months ago Encounter for general adult medical examination with abnormal findings   [facility] Ke[facility]P   9 months ago Primary osteoarthritis of left knee   [place] [facility] Medicine [facility]   9 months ago Mixed incontinence   Sou[place] BranchegCoralie KeensP   10 months ago Primary osteoarthritis of left knee   [facility]       Future Appointments             In 1 week Baity, RegCoralie KeensP [facility]iera HospitalEC             oxybutynin (DITROPAN-XL) 5 MG 24 hr tablet [Pharmacy Med Name: OXYBUTYNIN ER 5MG TABLETS] 90 tablet 1    Sig: TAKE 1 TABLET(5 MG) BY MOUTH AT BEDTIME     Urology:  Bladder Agents Passed - 12/20/2021  3:16 AM      Passed - Valid encounter within last 12 months    Recent Outpatient Visits           2 months ago Type 2 diabetes mellitus with stage 3a chronic kidney disease, without long-term current use of insulin ([place])   [facility] [place], Coralie Keens, NP   6 months ago Encounter for general adult medical examination with abnormal findings   [facility], Coralie Keens, NP   9 months ago Primary osteoarthritis of left knee   [facility] Primary Care and Sports Medicine at [facility], Earley Abide, MD   9 months ago Mixed incontinence   [place], Coralie Keens, NP   10 months ago Primary osteoarthritis of left knee   [facility] Primary Care and Sports Medicine at [facility], Earley Abide, MD       Future Appointments             In 1 week Baity, Coralie Keens, NP [facility], [facility]

## 2021-12-29 ENCOUNTER — Encounter: Payer: Self-pay | Admitting: Internal Medicine

## 2021-12-29 ENCOUNTER — Ambulatory Visit (INDEPENDENT_AMBULATORY_CARE_PROVIDER_SITE_OTHER): Payer: Medicare Other | Admitting: Internal Medicine

## 2021-12-29 VITALS — BP 114/71 | HR 64 | Temp 96.8°F | Wt 163.0 lb

## 2021-12-29 DIAGNOSIS — E119 Type 2 diabetes mellitus without complications: Secondary | ICD-10-CM

## 2021-12-29 DIAGNOSIS — E782 Mixed hyperlipidemia: Secondary | ICD-10-CM | POA: Diagnosis not present

## 2021-12-29 DIAGNOSIS — E1122 Type 2 diabetes mellitus with diabetic chronic kidney disease: Secondary | ICD-10-CM

## 2021-12-29 DIAGNOSIS — J432 Centrilobular emphysema: Secondary | ICD-10-CM | POA: Diagnosis not present

## 2021-12-29 DIAGNOSIS — N3946 Mixed incontinence: Secondary | ICD-10-CM

## 2021-12-29 DIAGNOSIS — I10 Essential (primary) hypertension: Secondary | ICD-10-CM

## 2021-12-29 DIAGNOSIS — K219 Gastro-esophageal reflux disease without esophagitis: Secondary | ICD-10-CM | POA: Diagnosis not present

## 2021-12-29 DIAGNOSIS — N1831 Chronic kidney disease, stage 3a: Secondary | ICD-10-CM

## 2021-12-29 DIAGNOSIS — Z23 Encounter for immunization: Secondary | ICD-10-CM

## 2021-12-29 DIAGNOSIS — E6609 Other obesity due to excess calories: Secondary | ICD-10-CM

## 2021-12-29 DIAGNOSIS — Z0001 Encounter for general adult medical examination with abnormal findings: Secondary | ICD-10-CM

## 2021-12-29 DIAGNOSIS — M1712 Unilateral primary osteoarthritis, left knee: Secondary | ICD-10-CM

## 2021-12-29 DIAGNOSIS — G4733 Obstructive sleep apnea (adult) (pediatric): Secondary | ICD-10-CM

## 2021-12-29 DIAGNOSIS — Z6831 Body mass index (BMI) 31.0-31.9, adult: Secondary | ICD-10-CM

## 2021-12-29 DIAGNOSIS — I700 Atherosclerosis of aorta: Secondary | ICD-10-CM

## 2021-12-29 LAB — POCT GLYCOSYLATED HEMOGLOBIN (HGB A1C): Hemoglobin A1C: 6.1 % — AB (ref 4.0–5.6)

## 2021-12-29 MED ORDER — OMEPRAZOLE 20 MG PO CPDR: 20.0000 mg | DELAYED_RELEASE_CAPSULE | Freq: Every day | ORAL | 1 refills | Status: AC

## 2021-12-29 NOTE — Assessment & Plan Note (Signed)
Controlled on amlodipine and losartan HCT Reinforced DASH diet and exercise for weight loss C-Met today 

## 2021-12-29 NOTE — Assessment & Plan Note (Signed)
C-Met and lipid profile today Encouraged her to consume low-fat diet Continue atorvastatin and aspirin 

## 2021-12-29 NOTE — Assessment & Plan Note (Signed)
Try to identify and avoid foods that trigger your reflux Encourage weight loss as this can help reduce reflux symptoms Continue omeprazole, refilled today

## 2021-12-29 NOTE — Assessment & Plan Note (Signed)
POCT A1c 6.1% We will check urine microalbumin at next visit Encourage low-carb diet and exercise for weight loss Continue metformin Encourage routine eye exam Encourage routine foot exam Flu shot today Pneumovax and Prevnar UTD Encouraged her to get her COVID booster

## 2021-12-29 NOTE — Assessment & Plan Note (Signed)
C-Met and lipid profile today Encouraged her to consume low-fat diet Continue atorvastatin 

## 2021-12-29 NOTE — Assessment & Plan Note (Signed)
Encourage smoking cessation Not currently using any inhalers

## 2021-12-29 NOTE — Patient Instructions (Signed)

## 2021-12-29 NOTE — Assessment & Plan Note (Signed)
Encourage weight loss as this can help reduce sleep apnea symptoms She does not currently have a CPAP machine

## 2021-12-29 NOTE — Assessment & Plan Note (Signed)
Encourage weight loss as this can help reduce joint pain Continue meloxicam

## 2021-12-29 NOTE — Progress Notes (Signed)
Subjective:    Patient ID: Darlene Mcdaniel, female    DOB: 06-17-1950, 71 y.o.   MRN: [id number]  HPI  Patient presents to clinic today for 77-monthfollow-up of chronic conditions.  HTN: Her BP today is 114/71.  She is taking Amlodipine, Losartan HCT as prescribed.  There is no ECG on file.  HLD with Aortic Atherosclerosis: Her last LDL was 84, triglycerides 229, 09/2021.  She denies myalgias on Atorvastatin.  She is taking Aspirin as well.  She tries to consume low-fat diet.  OSA: She averages 4 to 5 hours of sleep per night without the use of her CPAP.  Sleep study from 07/2019 reviewed.  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  She takes Meloxicam and uses Voltaren gel as needed with good relief of symptoms.  DM2: Her last A1c was 6.8%, 09/2021.  She is Metformin taking as prescribed.  She does not check her sugars.  She checks her feet routinely.  Her last eye exam was.  Flu 11/2019.  Pneumovax 06/2020.  Prevnar 06/2019.  COVID Moderna x3.  GERD: She is not sure what triggers this.  She denies breakthrough on Omeprazole.  There is no upper GI on file.  OAB: She reports mainly urinary frequency and urgency.  She is taking Ditropan as prescribed.  She does not follow with urology.  COPD: She denies chronic cough or shortness of breath.  She is not using any inhalers at this time.  She does continue to smoke.  There are no PFTs on file.  Review of Systems     Past Medical History:  Diagnosis Date   Hyperlipidemia    Hypertension     Current Outpatient Medications  Medication Sig Dispense Refill   acetaminophen (TYLENOL) 650 MG CR tablet Take 650 mg by mouth every 8 (eight) hours as needed for pain.     amLODipine (NORVASC) 10 MG tablet TAKE 1 TABLET(10 MG) BY MOUTH DAILY 90 tablet 1   aspirin EC 81 MG tablet Take 1 tablet (81 mg total) by mouth daily. Swallow whole. 90 tablet 1   atorvastatin (LIPITOR) 40 MG tablet TAKE 1 TABLET(40 MG) BY MOUTH DAILY 90 tablet 3   Blood Pressure Monitoring  (BLOOD PRESSURE KIT) DEVI 1 Units by Does not apply route 2 (two) times daily. 1 each 0   cetirizine (ZYRTEC) 10 MG tablet Take 10 mg by mouth every other day.     diclofenac Sodium (VOLTAREN) 1 % GEL Apply 2 g topically 4 (four) times daily. 50 g 1   losartan-hydrochlorothiazide (HYZAAR) 100-12.5 MG tablet TAKE 1 TABLET BY MOUTH DAILY 90 tablet 0   meloxicam (MOBIC) 7.5 MG tablet TAKE 1 TABLET(7.5 MG) BY MOUTH TWICE DAILY 180 tablet 1   metFORMIN (GLUCOPHAGE) 500 MG tablet TAKE 1 TABLET(500 MG) BY MOUTH DAILY WITH BREAKFAST 90 tablet 1   Omega-3 Fatty Acids (FISH OIL) 1000 MG CAPS Take by mouth.     omeprazole (PRILOSEC) 20 MG capsule TAKE 1 CAPSULE(20 MG) BY MOUTH DAILY 90 capsule 3   oxybutynin (DITROPAN-XL) 5 MG 24 hr tablet TAKE 1 TABLET(5 MG) BY MOUTH AT BEDTIME 90 tablet 1   No current facility-administered medications for this visit.    No Known Allergies  Family History  Problem Relation Age of Onset   Heart disease Mother    Stroke Mother    Diabetes Mother    Cancer Mother    Leukemia Mother    Breast cancer Sister     Social History  Socioeconomic History   Marital status: Single    Spouse name: Not on file   Number of children: 2   Years of education: Not on file   Highest education level: Not on file  Occupational History   Occupation: retired   Occupation: Retired  Tobacco Use   Smoking status: Every Day    Packs/day: 0.50    Years: 35.00    Total pack years: 17.50    Types: Cigarettes   Smokeless tobacco: Never  Vaping Use   Vaping Use: Never used  Substance and Sexual Activity   Alcohol use: Not Currently   Drug use: Never   Sexual activity: Yes    Partners: Male  Other Topics Concern   Not on file  Social History Narrative   Not on file   Social Determinants of Health   Financial Resource Strain: Low Risk  (07/25/2021)   Overall Financial Resource Strain (CARDIA)    Difficulty of Paying Living Expenses: Not hard at all  Food Insecurity: No  Food Insecurity (07/25/2021)   Hunger Vital Sign    Worried About Running Out of Food in the Last Year: Never true    [place] in the Last Year: Never true  Transportation Needs: No Transportation Needs (07/25/2021)   PRAPARE - Hydrologist (Medical): No    Lack of Transportation (Non-Medical): No  Physical Activity: Insufficiently Active (07/25/2021)   Exercise Vital Sign    Days of Exercise per Week: 5 days    Minutes of Exercise per Session: 10 min  Stress: No Stress Concern Present (07/25/2021)   [place]    Feeling of Stress : Not at all  Social Connections: Moderately Isolated (07/25/2021)   Social Connection and Isolation Panel [NHANES]    Frequency of Communication with Friends and Family: More than three times a week    Frequency of Social Gatherings with Friends and Family: Once a week    Attends Religious Services: More than 4 times per year    Active Member of [employer] or Organizations: No    Attends Archivist Meetings: Never    Marital Status: Separated  Intimate Partner Violence: Not At Risk (07/25/2021)   Humiliation, Afraid, Rape, and Kick questionnaire    Fear of Current or Ex-Partner: No    Emotionally Abused: No    Physically Abused: No    Sexually Abused: No     Constitutional: Denies fever, malaise, fatigue, headache or abrupt weight changes.  HEENT: Denies eye pain, eye redness, ear pain, ringing in the ears, wax buildup, runny nose, nasal congestion, bloody nose, or sore throat. Respiratory: Denies difficulty breathing, shortness of breath, cough or sputum production.   Cardiovascular: Denies chest pain, chest tightness, palpitations or swelling in the hands or feet.  Gastrointestinal: Denies abdominal pain, bloating, constipation, diarrhea or blood in the stool.  GU: Patient reports urinary urgency and frequency.  Denies pain with urination, burning  sensation, blood in urine, odor or discharge. Musculoskeletal: Patient reports swelling in right foot.  Denies decrease in range of motion, difficulty with gait, muscle pain or joint pain.  Skin: Denies redness, rashes, lesions or ulcercations.  Neurological: Patient reports numbness in right foot.  Denies dizziness, difficulty with memory, difficulty with speech or problems with balance and coordination.  Psych: Denies anxiety, depression, SI/HI.  No other specific complaints in a complete review of systems (except as listed in HPI above).  Objective:   Physical Exam  BP 114/71 (BP Location: Right Arm, Patient Position: Sitting, Cuff Size: Normal)   Pulse 64   Temp (!) 96.8 F (36 C) (Temporal)   Wt 163 lb (73.9 kg)   SpO2 97%   BMI 31.83 kg/m  Wt Readings from Last 3 Encounters:  12/29/21 163 lb (73.9 kg)  09/24/21 168 lb (76.2 kg)  07/25/21 165 lb (74.8 kg)    General: Appears her stated age, obese, in NAD. Skin: Warm, dry and intact. No ulcerations noted. HEENT: Head: normal shape and size; Eyes: sclera white, no icterus, conjunctiva pink, PERRLA and EOMs intact;  Cardiovascular: Normal rate and rhythm. S1,S2 noted.  No murmur, rubs or gallops noted. No JVD or BLE edema. No carotid bruits noted. Pulmonary/Chest: Normal effort and positive vesicular breath sounds. No respiratory distress. No wheezes, rales or ronchi noted.  Abdomen: Soft and nontender. Normal bowel sounds.  Musculoskeletal: Bunion noted of right foot.  No difficulty with gait.  Neurological: Alert and oriented.  Coordination normal.    BMET    Component Value Date/Time   NA 142 09/24/2021 1031   K 4.1 09/24/2021 1031   CL 110 09/24/2021 1031   CO2 24 09/24/2021 1031   GLUCOSE 134 (H) 09/24/2021 1031   BUN 20 09/24/2021 1031   CREATININE 0.87 09/24/2021 1031   CALCIUM 8.9 09/24/2021 1031   GFRNONAA 98 05/23/2019 1110   GFRAA 113 05/23/2019 1110    Lipid Panel     Component Value Date/Time   CHOL  162 09/24/2021 1031   TRIG 229 (H) 09/24/2021 1031   HDL 46 (L) 09/24/2021 1031   CHOLHDL 3.5 09/24/2021 1031   LDLCALC 84 09/24/2021 1031    CBC    Component Value Date/Time   WBC 8.1 06/19/2021 0942   RBC 3.87 06/19/2021 0942   HGB 11.9 06/19/2021 0942   HCT 36.1 06/19/2021 0942   PLT 352 06/19/2021 0942   MCV 93.3 06/19/2021 0942   MCH 30.7 06/19/2021 0942   MCHC 33.0 06/19/2021 0942   RDW 13.0 06/19/2021 0942   LYMPHSABS 2,133 05/23/2019 1110   EOSABS 52 05/23/2019 1110   BASOSABS 52 05/23/2019 1110    Hgb A1C Lab Results  Component Value Date   HGBA1C 6.1 (A) 12/29/2021            Assessment & Plan:     RTC in 6 months for annual exam Webb Silversmith, NP

## 2021-12-29 NOTE — Assessment & Plan Note (Signed)
Continue Ditropan Encourage timed voiding and Kegel exercises

## 2021-12-29 NOTE — Assessment & Plan Note (Signed)
Encourage diet and exercise for weight loss 

## 2021-12-30 LAB — COMPLETE METABOLIC PANEL WITH GFR
AG Ratio: 1.2 (calc) (ref 1.0–2.5)
ALT: 14 U/L (ref 6–29)
AST: 13 U/L (ref 10–35)
Albumin: 4 g/dL (ref 3.6–5.1)
Alkaline phosphatase (APISO): 82 U/L (ref 37–153)
BUN: 16 mg/dL (ref 7–25)
CO2: 27 mmol/L (ref 20–32)
Calcium: 9.6 mg/dL (ref 8.6–10.4)
Chloride: 106 mmol/L (ref 98–110)
Creat: 0.84 mg/dL (ref 0.50–1.05)
Globulin: 3.3 g/dL (calc) (ref 1.9–3.7)
Glucose, Bld: 104 mg/dL — ABNORMAL HIGH (ref 65–99)
Potassium: 4.4 mmol/L (ref 3.5–5.3)
Sodium: 142 mmol/L (ref 135–146)
Total Bilirubin: 0.4 mg/dL (ref 0.2–1.2)
Total Protein: 7.3 g/dL (ref 6.1–8.1)
eGFR: 75 mL/min/{1.73_m2} (ref >=60)

## 2021-12-30 LAB — LIPID PANEL
Cholesterol: 184 mg/dL (ref <200)
HDL: 59 mg/dL (ref >=50)
LDL Cholesterol (Calc): 97 mg/dL (calc)
Non-HDL Cholesterol (Calc): 125 mg/dL (calc) (ref <130)
Total CHOL/HDL Ratio: 3.1 (calc) (ref <5.0)
Triglycerides: 180 mg/dL — ABNORMAL HIGH (ref <150)

## 2022-01-19 IMAGING — DX DG KNEE COMPLETE 4+V*L*
5 series · 5 of 5 positions shown · non-contrast
Comparison: None.

CLINICAL DATA: Chronic left knee pain.

EXAM:
LEFT KNEE - COMPLETE 4+ VIEW

[knee ap]
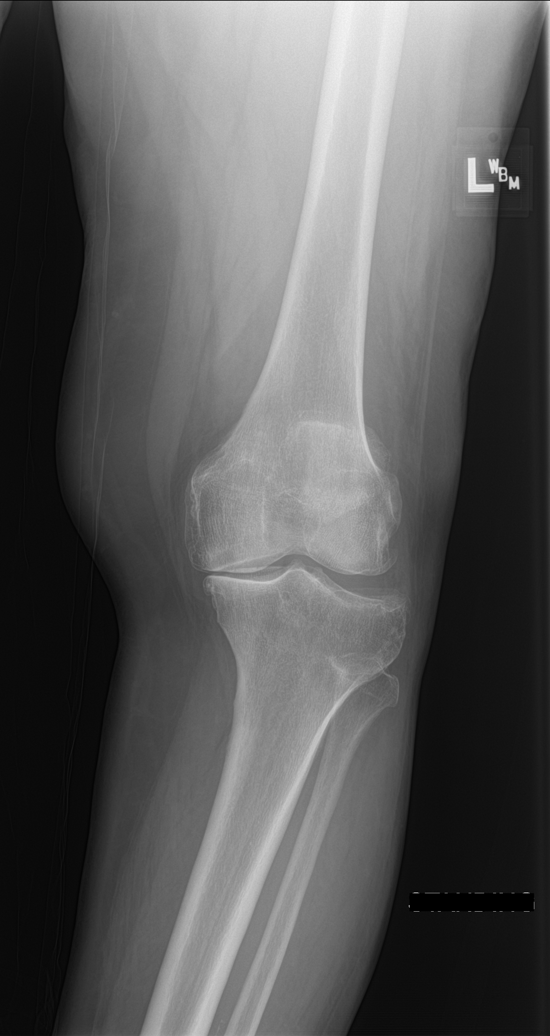

[knee lat]
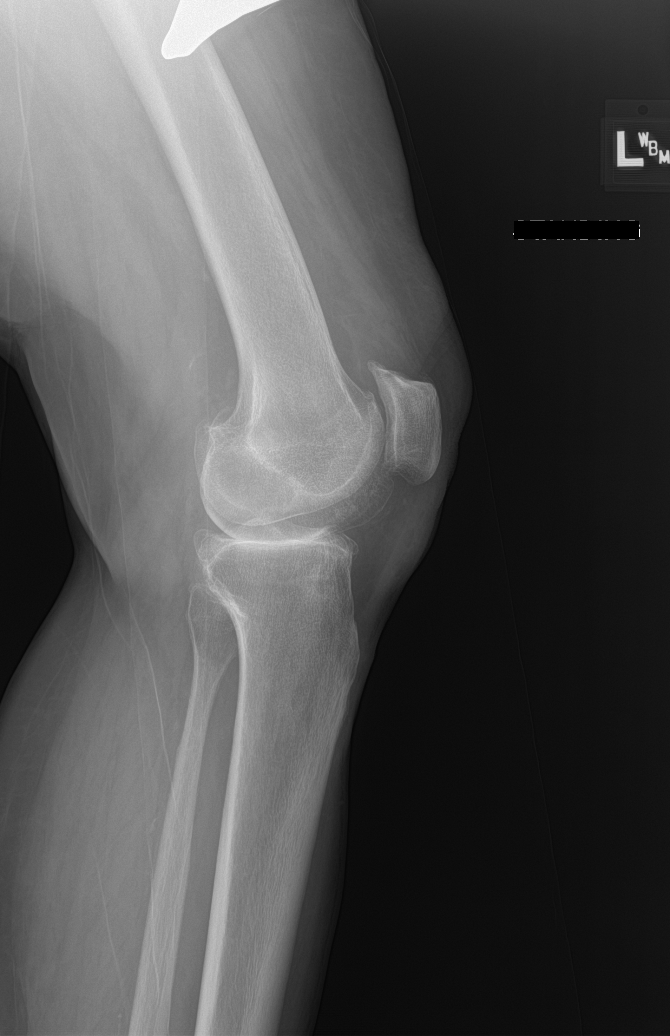

[patella skyline (1 of 2)]
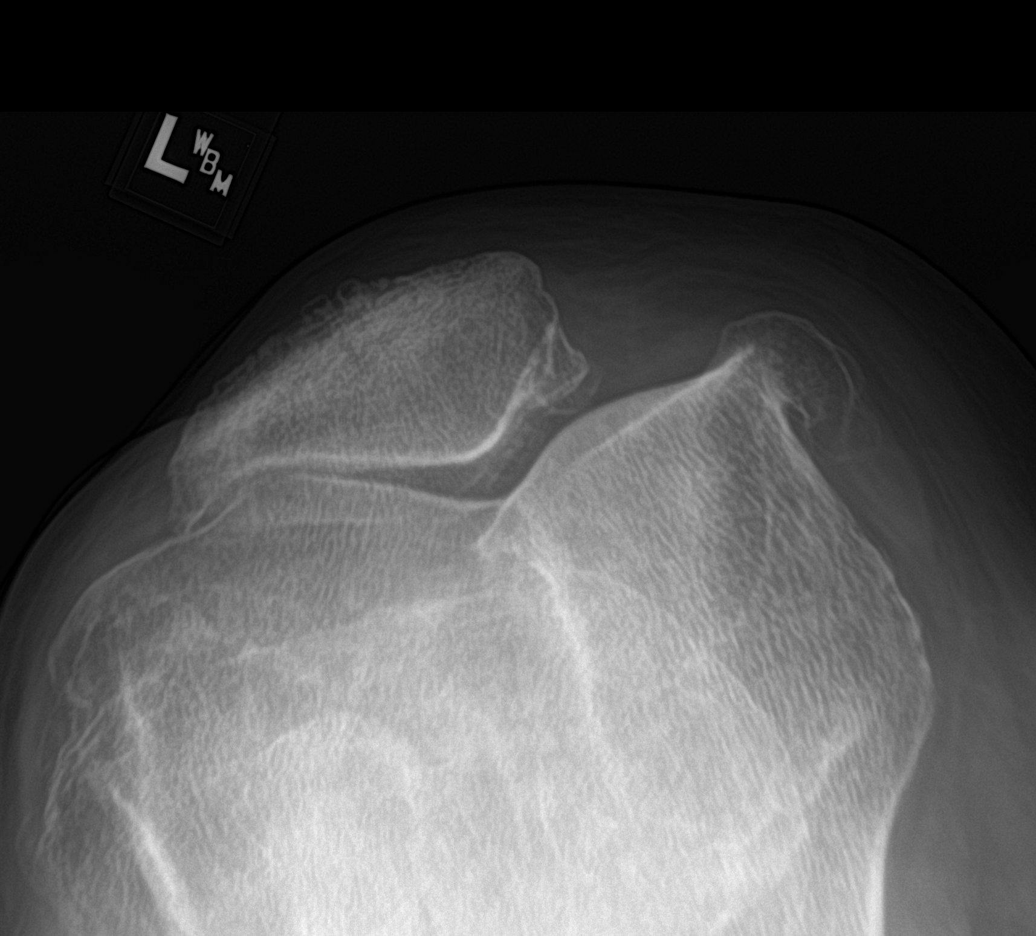

[knee tunnel]
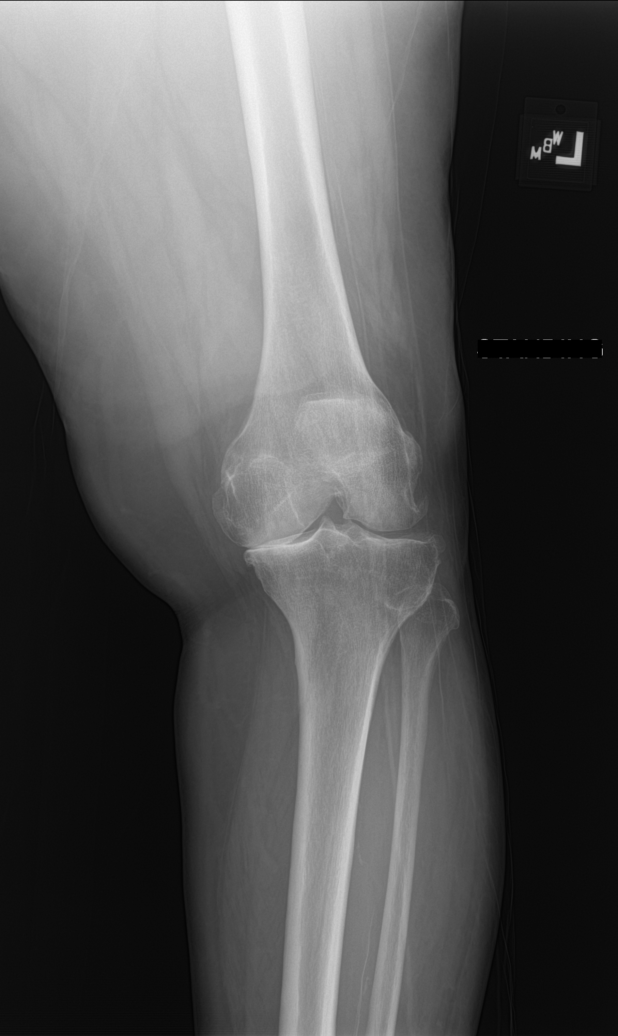

[patella skyline (2 of 2)]
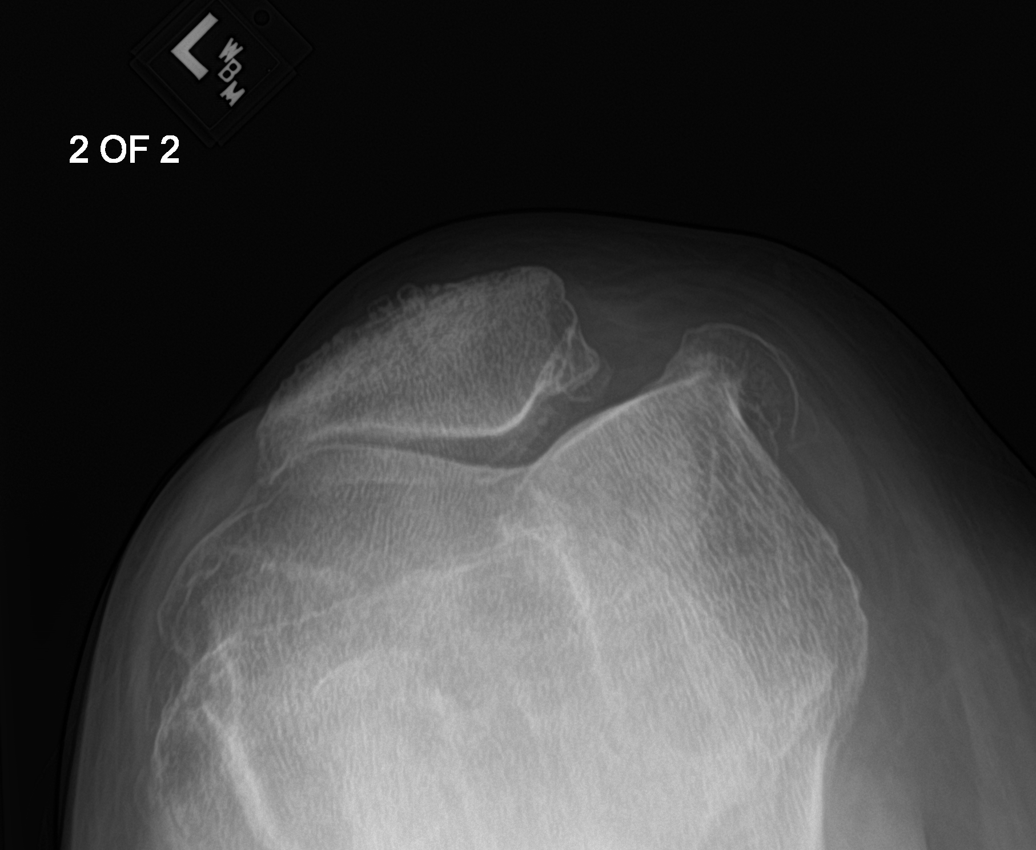

[5 of 5 positions shown; findings below may reference images not displayed]

FINDINGS: Valgus angulation. Narrowing of the medial tibiofemoral joint space.
Lateral patellar tilt. Tricompartmental peripheral osteophytes, most
prominently affecting the patellofemoral compartment. No significant
knee joint effusion. No fracture, erosion, or focal bone
abnormality. Mild prepatellar soft tissue edema.
IMPRESSION: 1. Tricompartmental osteoarthritis, most prominently affecting the
patellofemoral compartment.
2. Valgus angulation. Lateral patellar tilt.

## 2022-02-08 ENCOUNTER — Other Ambulatory Visit: Payer: Self-pay | Admitting: Internal Medicine

## 2022-02-08 DIAGNOSIS — I10 Essential (primary) hypertension: Secondary | ICD-10-CM

## 2022-02-09 NOTE — Telephone Encounter (Signed)
Requested Prescriptions  Pending Prescriptions Disp Refills   losartan-hydrochlorothiazide (HYZAAR) 100-12.5 MG tablet [Pharmacy Med Name: LOSARTAN/HCTZ 100/12.5MG  TABLETS] 90 tablet 1    Sig: TAKE 1 TABLET BY MOUTH DAILY     Cardiovascular: ARB + Diuretic Combos Passed - 02/08/2022  3:15 AM      Passed - K in normal range and within 180 days    Potassium  Date Value Ref Range Status  12/29/2021 4.4 3.5 - 5.3 mmol/L Final         Passed - Na in normal range and within 180 days    Sodium  Date Value Ref Range Status  12/29/2021 142 135 - 146 mmol/L Final         Passed - Cr in normal range and within 180 days    Creat  Date Value Ref Range Status  12/29/2021 0.84 0.50 - 1.05 mg/dL Final   Creatinine, Urine  Date Value Ref Range Status  09/24/2021 80 20 - 275 mg/dL Final         Passed - eGFR is 10 or above and within 180 days    GFR, Est African American  Date Value Ref Range Status  05/23/2019 113 > OR = 60 mL/min/1.39m2 Final   GFR, Est Non African American  Date Value Ref Range Status  05/23/2019 98 > OR = 60 mL/min/1.42m2 Final   eGFR  Date Value Ref Range Status  12/29/2021 75 > OR = 60 mL/min/1.51m2 Final         Passed - Patient is not pregnant      Passed - Last BP in normal range    BP Readings from Last 1 Encounters:  12/29/21 114/71         Passed - Valid encounter within last 6 months    Recent Outpatient Visits           1 month ago Type 2 diabetes mellitus with stage 3a chronic kidney disease, without long-term current use of insulin ([place])   [facility] [place], Coralie Keens, NP   4 months ago Type 2 diabetes mellitus with stage 3a chronic kidney disease, without long-term current use of insulin ([place])   [facility] [place], Coralie Keens, NP   7 months ago Encounter for general adult medical examination with abnormal findings   [facility] [place], Coralie Keens, NP   10 months ago Primary osteoarthritis of left knee    [facility] Medicine at [facility], Earley Abide, MD   11 months ago Mixed incontinence   [place], Coralie Keens, [place]

## 2022-02-20 ENCOUNTER — Other Ambulatory Visit: Payer: Self-pay | Admitting: Internal Medicine

## 2022-02-20 DIAGNOSIS — Z1231 Encounter for screening mammogram for malignant neoplasm of breast: Secondary | ICD-10-CM

## 2022-02-26 ENCOUNTER — Telehealth: Payer: Self-pay

## 2022-02-26 NOTE — Telephone Encounter (Signed)
Copied from [place] [phone] [phone]. Topic: Appointment Scheduling - Scheduling Inquiry for Clinic >> Feb 26, 2022 10:14 AM [name] wrote: Reason for CRM: The patient called in wanting to schedule an injection for her left knee. She last had an injection last February. Please contact patient to schedule as she prefers late morning an afternoon appointment.

## 2022-03-10 ENCOUNTER — Other Ambulatory Visit: Payer: Self-pay | Admitting: Internal Medicine

## 2022-03-10 DIAGNOSIS — Z13820 Encounter for screening for osteoporosis: Secondary | ICD-10-CM

## 2022-03-12 ENCOUNTER — Ambulatory Visit
Admission: RE | Admit: 2022-03-12 | Discharge: 2022-03-12 | Disposition: A | Discharge status: Home / Self Care | Payer: 59 | Source: Ambulatory Visit | Attending: Internal Medicine | Admitting: Internal Medicine

## 2022-03-12 DIAGNOSIS — Z1231 Encounter for screening mammogram for malignant neoplasm of breast: Secondary | ICD-10-CM | POA: Insufficient documentation

## 2022-03-18 ENCOUNTER — Encounter: Payer: Self-pay | Admitting: Family Medicine

## 2022-03-18 ENCOUNTER — Inpatient Hospital Stay (INDEPENDENT_AMBULATORY_CARE_PROVIDER_SITE_OTHER): Payer: 59 | Admitting: Radiology

## 2022-03-18 ENCOUNTER — Ambulatory Visit (INDEPENDENT_AMBULATORY_CARE_PROVIDER_SITE_OTHER): Payer: 59 | Admitting: Family Medicine

## 2022-03-18 VITALS — BP 118/62 | HR 72 | Ht 60.0 in | Wt 158.0 lb

## 2022-03-18 DIAGNOSIS — M1712 Unilateral primary osteoarthritis, left knee: Secondary | ICD-10-CM

## 2022-03-18 MED ORDER — CELECOXIB 100 MG PO CAPS: 100.0000 mg | ORAL_CAPSULE | Freq: Two times a day (BID) | ORAL | 0 refills | Status: DC | PRN

## 2022-03-18 MED ORDER — TRIAMCINOLONE ACETONIDE 32 MG IX SRER
32.0000 mg | Freq: Once | INTRA_ARTICULAR | Status: AC
  Administered 2022-03-18: 32 mg via INTRA_ARTICULAR

## 2022-03-18 NOTE — Patient Instructions (Addendum)
You have just been given a cortisone injection to reduce pain and inflammation. After the injection you may notice immediate relief of pain as a result of the Lidocaine. It is important to rest the area of the injection for 24 to 48 hours after the injection. There is a possibility of some temporary increased discomfort and swelling for up to 72 hours until the cortisone begins to work. If you do have pain, simply rest the joint and use ice. If you can tolerate over the counter medications, you can try Tylenol for added relief per package instructions. - As above, relative rest x 2 days then return to normal activity - Use hinged knee brace as-needed - Start physical therapy (call to schedule) - Return in 3 months  [facility] Physical Therapy:  [place]:  [place]: [phone]

## 2022-03-26 ENCOUNTER — Other Ambulatory Visit: Payer: Self-pay | Admitting: Internal Medicine

## 2022-03-26 DIAGNOSIS — I10 Essential (primary) hypertension: Secondary | ICD-10-CM

## 2022-03-26 NOTE — Telephone Encounter (Signed)
Requested medications are due for refill today.  yes  Requested medications are on the active medications list.  yes  Last refill. 09/24/2021 #90 1 rf  Future visit scheduled.   With Dr. Rodena Piety  Notes to clinic.  Telford Nab Entzminger listed as PCP.    Requested Prescriptions  Pending Prescriptions Disp Refills   amLODipine (NORVASC) 10 MG tablet [Pharmacy Med Name: AMLODIPINE BESYLATE 10MG TABLETS] 90 tablet 1    Sig: TAKE 1 TABLET(10 MG) BY MOUTH DAILY     Cardiovascular: Calcium Channel Blockers 2 Passed - 03/26/2022  3:16 AM      Passed - Last BP in normal range    BP Readings from Last 1 Encounters:  03/18/22 118/62         Passed - Last Heart Rate in normal range    Pulse Readings from Last 1 Encounters:  03/18/22 72         Passed - Valid encounter within last 6 months    Recent Outpatient Visits           1 week ago Osteoarthritis of left knee, unspecified osteoarthritis type   [place] [facility] at [facility], Earley Abide, MD   2 months ago Type 2 diabetes mellitus with stage 3a chronic kidney disease, without long-term current use of insulin [facility])   [facility] [place], [place] W, NP   6 months ago Type 2 diabetes mellitus with stage 3a chronic kidney disease, without long-term current use of insulin [facility])   [facility] [place], Coralie Keens, NP   9 months ago Encounter for general adult medical examination with abnormal findings   [facility] [place], Coralie Keens, NP   1 year ago Primary osteoarthritis of left knee   [facility] Primary Care & [facility] Medicine at [facility], Earley Abide, MD       Future Appointments             In 2 months Zigmund Daniel, Earley Abide, MD [place] at [facility]

## 2022-04-03 NOTE — Assessment & Plan Note (Signed)
Acute on chronic left knee pain in the setting of osteoarthritis despite NSAID usage.  Examination with tenderness about the joint lines, patellofemoral articulation.  Given treatments to date we discussed additional strategies and she did elect to proceed with ultrasound-guided Zilretta injection due to concomitant diabetes mellitus type 2, will write for brief course of Celebrex and did recommend formal physical therapy.

## 2022-04-03 NOTE — Progress Notes (Signed)
     Primary Care / Sports Medicine Office Visit  Patient Information:  Patient ID: Darlene Mcdaniel, female DOB: 06/13/1950 Age: 72 y.o. MRN: [id number]   Darlene Mcdaniel is a pleasant 72 y.o. female presenting with the following:  Chief Complaint  Patient presents with   Primary osteoarthritis of left knee    Couple months    Vitals:   03/18/22 1020  BP: 118/62  Pulse: 72  SpO2: 97%   Vitals:   03/18/22 1020  Weight: 158 lb (71.7 kg)  Height: 5' (1.524 m)   Body mass index is 30.86 kg/m.     Independent interpretation of notes and tests performed by another provider:   None  Procedures performed:   Procedure:  Injection of left knee joint under ultrasound guidance. Ultrasound guidance utilized for out of plane approach to left knee, no effusion noted Samsung HS60 device utilized with permanent recording / reporting. Verbal informed consent obtained and verified. Skin prepped in a sterile fashion. Ethyl chloride for topical local analgesia.  Completed without difficulty and tolerated well. Medication: Zilretta single-dose vial advised to contact for fevers/chills, erythema, induration, drainage, or persistent bleeding.   Pertinent History, Exam, Impression, and Recommendations:   Darlene Mcdaniel was seen today for primary osteoarthritis of left knee.  Osteoarthritis of left knee, unspecified osteoarthritis type Assessment & Plan: Acute on chronic left knee pain in the setting of osteoarthritis despite NSAID usage.  Examination with tenderness about the joint lines, patellofemoral articulation.  Given treatments to date we discussed additional strategies and she did elect to proceed with ultrasound-guided Zilretta injection due to concomitant diabetes mellitus type 2, will write for brief course of Celebrex and did recommend formal physical therapy.  Orders: -     [place] LIMITED JOINT SPACE STRUCTURES LOW LEFT; Future -     Triamcinolone Acetonide -     Ambulatory referral  to Physical Therapy -     Celecoxib; Take 1 capsule (100 mg total) by mouth 2 (two) times daily as needed.  Dispense: 60 capsule; Refill: 0     Orders & Medications Meds ordered this encounter  Medications   Triamcinolone Acetonide (ZILRETTA) intra-articular injection 32 mg   celecoxib (CELEBREX) 100 MG capsule    Sig: Take 1 capsule (100 mg total) by mouth 2 (two) times daily as needed.    Dispense:  60 capsule    Refill:  0   Orders Placed This Encounter  Procedures   [place] LIMITED JOINT SPACE STRUCTURES LOW LEFT   Ambulatory referral to Physical Therapy     Return in about 3 months (around 06/16/2022) for f/u left knee.     Montel Culver, MD, [facility]   Primary Care Sports Medicine Primary Care and Sports Medicine at [facility]

## 2022-04-06 NOTE — Therapy (Signed)
OUTPATIENT PHYSICAL THERAPY KNEE EVALUATION  Patient Name: Darlene Mcdaniel MRN: [id number] DOB:1950/12/13, 72 y.o., female Today's Date: 04/07/2022   PT End of Session - 04/07/22 1135     Visit Number 1    Number of Visits 13    Date for PT Re-Evaluation 06/02/22    Authorization Type eval: 04/07/22    PT Start Time 1145    PT Stop Time 1230    PT Time Calculation (min) 45 min    Activity Tolerance Patient tolerated treatment well    Behavior During Therapy [facility] for tasks assessed/performed            Past Medical History:  Diagnosis Date   Hyperlipidemia    Hypertension    Past Surgical History:  Procedure Laterality Date   NO PAST SURGERIES     Patient Active Problem List   Diagnosis Date Noted   DM (diabetes mellitus), type 2 ([place]) 09/24/2021   GERD (gastroesophageal reflux disease) 09/24/2021   Aortic atherosclerosis ([place]) 06/19/2021   Pulmonary emphysema ([place]) 06/19/2021   Mixed incontinence 03/10/2021   Class 1 obesity due to excess calories with body mass index (BMI) of 31.0 to 31.9 in adult 02/13/2021   Osteoarthritis of left knee 06/18/2020   Obstructive sleep apnea 07/18/2019   Hyperlipidemia 06/21/2019   Hypertension 05/23/2019   PCP: Waylan Rocher, MD  REFERRING PROVIDER: Montel Culver, MD  REFERRING DIAGNOSIS: M17.12 (ICD-10-CM) - Osteoarthritis of left knee, unspecified osteoarthritis type   THERAPY DIAG: Chronic pain of left knee  Muscle weakness (generalized)  RATIONALE FOR EVALUATION AND TREATMENT: Rehabilitation  ONSET DATE: 03/2020 (approximate)  FOLLOW UP APPT WITH PROVIDER: Yes, 06/16/22   SUBJECTIVE:                                                                                                                                                                                         Chief Complaint: Chronic L knee pain;  Pertinent History Acute on chronic left knee pain in the setting of osteoarthritis. Previously seen in  physical therapy for this issue in 2023.  Underwent Zilretta injection in L knee on 03/18/22 and was started on a brief course of Celebrex. At the time of PT evaluation L knee pain has resolved. Pt would like to work on strengthening in order to prevent pain from returning.  History from PT evaluation 03/28/21: Pt has history of L knee pain that started ~1 year ago and began gradually getting worse. ~6 weeks ago pain got so intense that the throbbing would wake her up from her sleep in the middle of the night. Went to go see her GP who  referred her to Dr. Zigmund Mcdaniel who gave her an injection in her knee on 02/24/21. Since the injection her knee pain has significantly decreased, but she has been experiencing some buckling in her knee. She reports no falls with the buckling. Pt reports still occasionally feeling the pain in her L knee, but no where near as bad as it had been before the injection. Prior to the injection the pt stated that she was an entirely different person due to the amount of pain she was in. Prior to the injection the pain at its worst would reach a 10/10 on the NPRS. Pt reports the pain was constant and throbbing. Pt stated that in the mornings her knee would be stiff and painful, but after she took a hot shower and started moving it would ease up, then after some activity the pain would increase again. To ease the pain the pt mainly just tried to stay off of the leg. Pt stated that when she did have to walk she "walked like a rocking chair". Pt still can not walk for long durations, but she reports that it is more due to fatigue than pain. Pt has no previous back, hip, knee or ankle injuries that she can recall. Pt describes herself as sedentary and does no perform much physical activity. Pt enjoys reading and would like to be able to walk around [place] and the grocery store. Pt lives in a single level home with no stairs.  Pain:  Pain Intensity: Present: 0/10, Best: 0/10, Worst: 0/10 (no pain  s/p injection); Pain location: No pain currently but prior to injection generalized L knee pain; Pain quality: aching and throbbing   Radiating pain: No  Swelling: No  Popping, catching, locking: Yes, was catching prior to injection Numbness/Tingling: No Focal weakness or buckling: Yes, occasional buckling Aggravating factors: extended sitting and standing, "squatting is not even an option" Relieving factors: Zilretta injection, Voltaren  24-hour pain behavior: Varied based on activity History of prior back, hip, or knee injury, pain, surgery, or therapy: No Falls: Has patient fallen in last 6 months? No, Dominant hand: right Imaging: Yes, IMPRESSION: 1. Tricompartmental osteoarthritis, most prominently affecting the patellofemoral compartment. 2. Valgus angulation. Lateral patellar tilt. Prior level of function: Independent Occupational demands: Retired Office manager: [employer] Red flags: Negative for chills/fever, night sweats, nausea, vomiting, unrelenting pain;  Precautions: None  Weight Bearing Restrictions: No  Living Environment Lives with: lives alone Lives in: House/apartment, no steps; uses a cane occasionally when knee is hurting  Patient Goals: Prevent L knee pain from returning and strengthen her LLE;   OBJECTIVE:   Patient Surveys  FOTO 51, predicted improvement to 43  Cognition Patient is oriented to person, place, and time.  Recent memory is intact.  Remote memory is intact.  Attention span and concentration are intact.  Expressive speech is intact.  Patient's fund of knowledge is within normal limits for educational level.    Gross Musculoskeletal Assessment Tremor: None Bulk: Normal Tone: Normal No visible step-off along spinal column, no signs of scoliosis  GAIT: Mildly antalgic gait noted with varus angulation noted in L knee. Decreased self-selected gait speed.  Assistive device utilized: None Level of assistance: Complete  Independence  Posture: Full posture screening deferred;  AROM AROM (Normal range in degrees) AROM  04/07/2022  Hip Right Left  Flexion (125) WNL WNL  Extension (15) WNL WNL  Abduction (40)    Adduction     Internal Rotation (45) WNL WNL  External Rotation (45)  WNL WNL      Knee    Flexion (135) WNL WNL* (pain with overpressure)  Extension (0) WNL WNL* (pain with overpressure)  (* = pain; Blank rows = not tested)  LE MMT: MMT (out of 5) Right 04/07/2022 Left 04/07/2022  Hip flexion 4+ 4  Hip extension 3+ 3+  Hip abduction 3+ 3+  Hip adduction 4 4  Hip internal rotation    Hip external rotation    Knee flexion 4 3+  Knee extension 5 4*  Ankle dorsiflexion 5 4* (pain in knee)  (* = pain; Blank rows = not tested)  Sensation Deferred  Reflexes Deferred  Muscle Length Hamstrings: R: Not examined L: Negative (at least 90 degrees) Quadriceps Pat Patrick): R: Shortened L: Shortened  Palpation Location LEFT  RIGHT           Quadriceps 0   Medial Hamstrings 0   Lateral Hamstrings 0   Lateral Hamstring tendon 0   Medial Hamstring tendon 0   Quadriceps tendon 0   Patella 0   Patellar Tendon 1   Tibial Tuberosity 0   Medial joint line 2   Lateral joint line 0   MCL 2   LCL 0   Adductor Tubercle 0   Pes Anserine tendon 1   Infrapatellar fat pad    Fibular head 0   Popliteal fossa 0   (Blank rows = not tested) Graded on 0-4 scale (0 = no pain, 1 = pain, 2 = pain with wincing/grimacing/flinching, 3 = pain with withdrawal, 4 = unwilling to allow palpation), (Blank rows = not tested)  Passive Accessory Motion Deferred  VASCULAR Dorsalis pedis and posterior tibial pulses are palpable  SPECIAL TESTS  Ligamentous Stability  ACL: Lachman's: R: Not examined L: Negative Anterior Drawer: R: Not examined L: Negative  PCL: Posterior Drawer: R: Not examined L: Negative Reverse Lachman's: R: Not examined L: Negative Posterior Sag Sign: R: Not examined L:  Negative  MCL: Valgus Stress (30 degrees flexion): R: Not examined L: Negative  LCL: Varus Stress (30 degrees flexion): R: Not examined L: Negative  Meniscus Tests McMurray's Test:  Medial Meniscus (Tibial ER): R: Not examined L: Negative Lateral Meniscus (Tibial IR): R: Not examined L: Negative  Thessaly: R: Not examined L: Not examined Ege's: R: Not examined L: Not examined  Patellofemoral Pain Syndrome Patellar Tilt (Lateral): R: Not tested L: Negative Patellar Apprehension: R: Not tested L: Positive Squatting pain: R: Not examined L: Positive Stair climbing pain: R: Not examined L: Not examined Kneeling pain: R: Not examined L: Not examined Resisted knee extension pain: R: Not examined L: Positive Compression: R: Not examined L: Positive Clarke's sign: R: Not examined L: Positive Lateral Pull: R: Not examined L: Not examined  Patellar Tendinopathy Inferior pole palpation with anterior tilt: R: Not examined L: Negative    TODAY'S TREATMENT    Ther-ex  NuStep L1-4 x 10 minutes for LE strengthening; Supine L quad set over towel roll 5s hold x 10; Supine L SLR with 2# ankle weight (AW) x 10; Hooklying bridges with green tband around knees x 10; R sidelying L hip abduction with 2# AW x 10; HEP issued and reviewed;   PATIENT EDUCATION:  Education details: Plan of care and HEP; Person educated: Patient Education method: Explanation, verbal cues, and handout; Education comprehension: verbalized understanding and returned demonstration;   HOME EXERCISE PROGRAM: Access Code: [email or web address] URL: [email or web address] Date: 04/07/2022 Prepared by: Roxana Hires  Exercises - Supine Quad Set  -  1 x daily - 7 x weekly - 2-3 sets - 10 reps - 3s hold - Supine Active Straight Leg Raise (Mirrored)  - 1 x daily - 7 x weekly - 2-3 sets - 10 reps - 3s hold - Bridge with Hip Abduction and Resistance  - 1 x daily - 7 x weekly - 2-3 sets - 10 reps - 3s hold - Sidelying  Hip Abduction (Mirrored)  - 1 x daily - 7 x weekly - 2-3 sets - 10 reps - 3s hold   ASSESSMENT:  CLINICAL IMPRESSION: Patient is a 72 y.o. female who was seen today for physical therapy evaluation and treatment for L knee pain. At the time of evaluation L knee pain has mostly resolved. Plan to progress strengthening with therapy.  OBJECTIVE IMPAIRMENTS: Abnormal gait, difficulty walking, and pain.   ACTIVITY LIMITATIONS: sitting, standing, squatting, and walking extended distances  PARTICIPATION LIMITATIONS: community activity  PERSONAL FACTORS: Age, Past/current experiences, Time since onset of injury/illness/exacerbation, and 3+ comorbidities: OA, DM, and emphysema are also affecting patient's functional outcome.   REHAB POTENTIAL: Excellent  CLINICAL DECISION MAKING: Stable/uncomplicated  EVALUATION COMPLEXITY: Low   GOALS: Goals reviewed with patient? Yes  SHORT TERM GOALS: Target date: 04/28/2022  Pt will be independent with HEP to improve strength and decrease knee pain to improve pain-free function at home Baseline:  Goal status: INITIAL   LONG TERM GOALS: Target date: 05/19/2022  Pt will increase FOTO to at least 58 to demonstrate significant improvement in function at home and work related to knee pain  Baseline: 04/07/22: 51; Goal status: INITIAL  2.  Pt will increase strength of pain-free L knee extension and L hip abduction by at least 1/2 MMT grade in order to demonstrate improvement in strength and function  Baseline: 04/07/22: L knee extension: 4/5, painful; L hip abduction: 3+/5, no pain; Goal status: INITIAL  3.  Pt will report at least 75% improvement in her L knee symptoms in order demonstrate clinically significant reduction in knee pain/disability.       Baseline:  Goal status: INITIAL   PLAN: PT FREQUENCY: 1-2x/week  PT DURATION: 6 weeks  PLANNED INTERVENTIONS: Therapeutic exercises, Therapeutic activity, Neuromuscular re-education, Balance  training, Gait training, Patient/Family education, Joint manipulation, Joint mobilization, Canalith repositioning, Aquatic Therapy, Dry Needling, Electrical stimulation, Spinal manipulation, Spinal mobilization, Cryotherapy, Moist heat, Taping, Traction, Ultrasound, Ionotophoresis '4mg'$ /ml Dexamethasone, and Manual therapy  PLAN FOR NEXT SESSION: Review/modify HEP as needed, progress LLE strengthening;   Lyndel Safe Hebe Merriwether PT, DPT, GCS  Peja Allender 04/07/2022, 12:49 PM

## 2022-04-07 ENCOUNTER — Ambulatory Visit: Payer: 59 | Attending: Family Medicine

## 2022-04-07 DIAGNOSIS — M6281 Muscle weakness (generalized): Secondary | ICD-10-CM | POA: Insufficient documentation

## 2022-04-07 DIAGNOSIS — R2689 Other abnormalities of gait and mobility: Secondary | ICD-10-CM | POA: Insufficient documentation

## 2022-04-07 DIAGNOSIS — M25562 Pain in left knee: Secondary | ICD-10-CM | POA: Insufficient documentation

## 2022-04-07 DIAGNOSIS — M1712 Unilateral primary osteoarthritis, left knee: Secondary | ICD-10-CM | POA: Insufficient documentation

## 2022-04-07 DIAGNOSIS — G8929 Other chronic pain: Secondary | ICD-10-CM | POA: Insufficient documentation

## 2022-04-08 ENCOUNTER — Other Ambulatory Visit: Payer: 59

## 2022-04-09 ENCOUNTER — Ambulatory Visit: Payer: 59

## 2022-04-10 NOTE — Therapy (Signed)
OUTPATIENT PHYSICAL THERAPY KNEE TREATMENT  Patient Name: Darlene Mcdaniel MRN: [id number] DOB:1950/11/18, 72 y.o., female Today's Date: 04/10/2022    Past Medical History:  Diagnosis Date   Hyperlipidemia    Hypertension    Past Surgical History:  Procedure Laterality Date   NO PAST SURGERIES     Patient Active Problem List   Diagnosis Date Noted   DM (diabetes mellitus), type 2 ([place]) 09/24/2021   GERD (gastroesophageal reflux disease) 09/24/2021   Aortic atherosclerosis ([place]) 06/19/2021   Pulmonary emphysema ([place]) 06/19/2021   Mixed incontinence 03/10/2021   Class 1 obesity due to excess calories with body mass index (BMI) of 31.0 to 31.9 in adult 02/13/2021   Osteoarthritis of left knee 06/18/2020   Obstructive sleep apnea 07/18/2019   Hyperlipidemia 06/21/2019   Hypertension 05/23/2019   PCP: Waylan Rocher, MD  REFERRING PROVIDER: Montel Culver, MD  REFERRING DIAGNOSIS: M17.12 (ICD-10-CM) - Osteoarthritis of left knee, unspecified osteoarthritis type   THERAPY DIAG: Chronic pain of left knee  Muscle weakness (generalized)  Other abnormalities of gait and mobility  RATIONALE FOR EVALUATION AND TREATMENT: Rehabilitation  ONSET DATE: 03/2020 (approximate)  FOLLOW UP APPT WITH PROVIDER: Yes, 06/16/22  FROM INITIAL EVALUATION (04/07/22) SUBJECTIVE:                                                                                                                                                                                         Chief Complaint: Chronic L knee pain;  Pertinent History Acute on chronic left knee pain in the setting of osteoarthritis. Previously seen in physical therapy for this issue in 2023.  Underwent Zilretta injection in L knee on 03/18/22 and was started on a brief course of Celebrex. At the time of PT evaluation L knee pain has resolved. Pt would like to work on strengthening in order to prevent pain from returning.  History from PT  evaluation 03/28/21: Pt has history of L knee pain that started ~1 year ago and began gradually getting worse. ~6 weeks ago pain got so intense that the throbbing would wake her up from her sleep in the middle of the night. Went to go see her GP who referred her to Dr. Zigmund Daniel who gave her an injection in her knee on 02/24/21. Since the injection her knee pain has significantly decreased, but she has been experiencing some buckling in her knee. She reports no falls with the buckling. Pt reports still occasionally feeling the pain in her L knee, but no where near as bad as it had been before the injection. Prior to the injection the pt stated that she was an entirely different  person due to the amount of pain she was in. Prior to the injection the pain at its worst would reach a 10/10 on the NPRS. Pt reports the pain was constant and throbbing. Pt stated that in the mornings her knee would be stiff and painful, but after she took a hot shower and started moving it would ease up, then after some activity the pain would increase again. To ease the pain the pt mainly just tried to stay off of the leg. Pt stated that when she did have to walk she "walked like a rocking chair". Pt still can not walk for long durations, but she reports that it is more due to fatigue than pain. Pt has no previous back, hip, knee or ankle injuries that she can recall. Pt describes herself as sedentary and does no perform much physical activity. Pt enjoys reading and would like to be able to walk around [place] and the grocery store. Pt lives in a single level home with no stairs.  Pain:  Pain Intensity: Present: 0/10, Best: 0/10, Worst: 0/10 (no pain s/p injection); Pain location: No pain currently but prior to injection generalized L knee pain; Pain quality: aching and throbbing   Radiating pain: No  Swelling: No  Popping, catching, locking: Yes, was catching prior to injection Numbness/Tingling: No Focal weakness or buckling: Yes,  occasional buckling Aggravating factors: extended sitting and standing, "squatting is not even an option" Relieving factors: Zilretta injection, Voltaren  24-hour pain behavior: Varied based on activity History of prior back, hip, or knee injury, pain, surgery, or therapy: No Falls: Has patient fallen in last 6 months? No, Dominant hand: right Imaging: Yes, IMPRESSION: 1. Tricompartmental osteoarthritis, most prominently affecting the patellofemoral compartment. 2. Valgus angulation. Lateral patellar tilt. Prior level of function: Independent Occupational demands: Retired Office manager: [employer] Red flags: Negative for chills/fever, night sweats, nausea, vomiting, unrelenting pain;  Precautions: None  Weight Bearing Restrictions: No  Living Environment Lives with: lives alone Lives in: House/apartment, no steps; uses a cane occasionally when knee is hurting  Patient Goals: Prevent L knee pain from returning and strengthen her LLE;   OBJECTIVE:   Patient Surveys  FOTO 51, predicted improvement to 14  Cognition Patient is oriented to person, place, and time.  Recent memory is intact.  Remote memory is intact.  Attention span and concentration are intact.  Expressive speech is intact.  Patient's fund of knowledge is within normal limits for educational level.    Gross Musculoskeletal Assessment Tremor: None Bulk: Normal Tone: Normal No visible step-off along spinal column, no signs of scoliosis  GAIT: Mildly antalgic gait noted with varus angulation noted in L knee. Decreased self-selected gait speed.  Assistive device utilized: None Level of assistance: Complete Independence  Posture: Full posture screening deferred;  AROM AROM (Normal range in degrees) AROM  04/07/2022  Hip Right Left  Flexion (125) WNL WNL  Extension (15) WNL WNL  Abduction (40)    Adduction     Internal Rotation (45) WNL WNL  External Rotation (45) WNL WNL      Knee    Flexion (135) WNL WNL*  (pain with overpressure)  Extension (0) WNL WNL* (pain with overpressure)  (* = pain; Blank rows = not tested)  LE MMT: MMT (out of 5) Right 04/07/2022 Left 04/07/2022  Hip flexion 4+ 4  Hip extension 3+ 3+  Hip abduction 3+ 3+  Hip adduction 4 4  Hip internal rotation    Hip external rotation  Knee flexion 4 3+  Knee extension 5 4*  Ankle dorsiflexion 5 4* (pain in knee)  (* = pain; Blank rows = not tested)  Sensation Deferred  Reflexes Deferred  Muscle Length Hamstrings: R: Not examined L: Negative (at least 90 degrees) Quadriceps Pat Patrick): R: Shortened L: Shortened  Palpation Location LEFT  RIGHT           Quadriceps 0   Medial Hamstrings 0   Lateral Hamstrings 0   Lateral Hamstring tendon 0   Medial Hamstring tendon 0   Quadriceps tendon 0   Patella 0   Patellar Tendon 1   Tibial Tuberosity 0   Medial joint line 2   Lateral joint line 0   MCL 2   LCL 0   Adductor Tubercle 0   Pes Anserine tendon 1   Infrapatellar fat pad    Fibular head 0   Popliteal fossa 0   (Blank rows = not tested) Graded on 0-4 scale (0 = no pain, 1 = pain, 2 = pain with wincing/grimacing/flinching, 3 = pain with withdrawal, 4 = unwilling to allow palpation), (Blank rows = not tested)  Passive Accessory Motion Deferred  VASCULAR Dorsalis pedis and posterior tibial pulses are palpable  SPECIAL TESTS  Ligamentous Stability  ACL: Lachman's: R: Not examined L: Negative Anterior Drawer: R: Not examined L: Negative  PCL: Posterior Drawer: R: Not examined L: Negative Reverse Lachman's: R: Not examined L: Negative Posterior Sag Sign: R: Not examined L: Negative  MCL: Valgus Stress (30 degrees flexion): R: Not examined L: Negative  LCL: Varus Stress (30 degrees flexion): R: Not examined L: Negative  Meniscus Tests McMurray's Test:  Medial Meniscus (Tibial ER): R: Not examined L: Negative Lateral Meniscus (Tibial IR): R: Not examined L: Negative  Thessaly: R: Not examined L:  Not examined Ege's: R: Not examined L: Not examined  Patellofemoral Pain Syndrome Patellar Tilt (Lateral): R: Not tested L: Negative Patellar Apprehension: R: Not tested L: Positive Squatting pain: R: Not examined L: Positive Stair climbing pain: R: Not examined L: Not examined Kneeling pain: R: Not examined L: Not examined Resisted knee extension pain: R: Not examined L: Positive Compression: R: Not examined L: Positive Clarke's sign: R: Not examined L: Positive Lateral Pull: R: Not examined L: Not examined  Patellar Tendinopathy Inferior pole palpation with anterior tilt: R: Not examined L: Negative    TODAY'S TREATMENT  Review/modify HEP as needed, progress LLE strengthening;  Ther-ex  NuStep L1-4 x 10 minutes for LE strengthening; Supine L quad set over towel roll 5s hold x 10; Supine L SLR with 2# ankle weight (AW) x 10; Hooklying bridges with green tband around knees x 10; R sidelying L hip abduction with 2# AW x 10; HEP issued and reviewed;   PATIENT EDUCATION:  Education details: Plan of care and HEP; Person educated: Patient Education method: Explanation, verbal cues, and handout; Education comprehension: verbalized understanding and returned demonstration;   HOME EXERCISE PROGRAM: Access Code: [email or web address] URL: [email or web address] Date: 04/07/2022 Prepared by: Roxana Hires  Exercises - Supine Quad Set  - 1 x daily - 7 x weekly - 2-3 sets - 10 reps - 3s hold - Supine Active Straight Leg Raise (Mirrored)  - 1 x daily - 7 x weekly - 2-3 sets - 10 reps - 3s hold - Bridge with Hip Abduction and Resistance  - 1 x daily - 7 x weekly - 2-3 sets - 10 reps - 3s hold - Sidelying Hip Abduction (Mirrored)  -  1 x daily - 7 x weekly - 2-3 sets - 10 reps - 3s hold   ASSESSMENT:  CLINICAL IMPRESSION: Patient is a 72 y.o. female who was seen today for physical therapy evaluation and treatment for L knee pain. At the time of evaluation L knee pain has mostly  resolved. Plan to progress strengthening with therapy.  OBJECTIVE IMPAIRMENTS: Abnormal gait, difficulty walking, and pain.   ACTIVITY LIMITATIONS: sitting, standing, squatting, and walking extended distances  PARTICIPATION LIMITATIONS: community activity  PERSONAL FACTORS: Age, Past/current experiences, Time since onset of injury/illness/exacerbation, and 3+ comorbidities: OA, DM, and emphysema are also affecting patient's functional outcome.   REHAB POTENTIAL: Excellent  CLINICAL DECISION MAKING: Stable/uncomplicated  EVALUATION COMPLEXITY: Low   GOALS: Goals reviewed with patient? Yes  SHORT TERM GOALS: Target date: 04/28/2022  Pt will be independent with HEP to improve strength and decrease knee pain to improve pain-free function at home Baseline:  Goal status: INITIAL   LONG TERM GOALS: Target date: 05/19/2022  Pt will increase FOTO to at least 58 to demonstrate significant improvement in function at home and work related to knee pain  Baseline: 04/07/22: 51; Goal status: INITIAL  2.  Pt will increase strength of pain-free L knee extension and L hip abduction by at least 1/2 MMT grade in order to demonstrate improvement in strength and function  Baseline: 04/07/22: L knee extension: 4/5, painful; L hip abduction: 3+/5, no pain; Goal status: INITIAL  3.  Pt will report at least 75% improvement in her L knee symptoms in order demonstrate clinically significant reduction in knee pain/disability.       Baseline:  Goal status: INITIAL   PLAN: PT FREQUENCY: 1-2x/week  PT DURATION: 6 weeks  PLANNED INTERVENTIONS: Therapeutic exercises, Therapeutic activity, Neuromuscular re-education, Balance training, Gait training, Patient/Family education, Joint manipulation, Joint mobilization, Canalith repositioning, Aquatic Therapy, Dry Needling, Electrical stimulation, Spinal manipulation, Spinal mobilization, Cryotherapy, Moist heat, Taping, Traction, Ultrasound, Ionotophoresis '4mg'$ /ml  Dexamethasone, and Manual therapy  PLAN FOR NEXT SESSION: Review/modify HEP as needed, progress LLE strengthening;   Lyndel Safe Traniya Prichett PT, DPT, GCS  Muriel Hannold 04/10/2022, 8:36 AM

## 2022-04-13 ENCOUNTER — Ambulatory Visit: Payer: 59

## 2022-04-13 DIAGNOSIS — R2689 Other abnormalities of gait and mobility: Secondary | ICD-10-CM

## 2022-04-13 DIAGNOSIS — G8929 Other chronic pain: Secondary | ICD-10-CM

## 2022-04-13 DIAGNOSIS — M25562 Pain in left knee: Secondary | ICD-10-CM | POA: Diagnosis not present

## 2022-04-13 DIAGNOSIS — M6281 Muscle weakness (generalized): Secondary | ICD-10-CM

## 2022-04-14 ENCOUNTER — Other Ambulatory Visit: Payer: Self-pay | Admitting: Family Medicine

## 2022-04-14 DIAGNOSIS — M1712 Unilateral primary osteoarthritis, left knee: Secondary | ICD-10-CM

## 2022-04-14 NOTE — Telephone Encounter (Signed)
Requested Prescriptions  Pending Prescriptions Disp Refills   celecoxib (CELEBREX) 100 MG capsule [Pharmacy Med Name: CELECOXIB '100MG'$  CAPSULES] 60 capsule 0    Sig: TAKE 1 CAPSULE(100 MG) BY MOUTH TWICE DAILY AS NEEDED     Analgesics:  COX2 Inhibitors Failed - 04/14/2022  3:15 AM      Failed - Manual Review: Labs are only required if the patient has taken medication for more than 8 weeks.      Passed - HGB in normal range and within 360 days    Hemoglobin  Date Value Ref Range Status  06/19/2021 11.9 11.7 - 15.5 g/dL Final         Passed - Cr in normal range and within 360 days    Creat  Date Value Ref Range Status  12/29/2021 0.84 0.50 - 1.05 mg/dL Final   Creatinine, Urine  Date Value Ref Range Status  09/24/2021 80 20 - 275 mg/dL Final         Passed - HCT in normal range and within 360 days    HCT  Date Value Ref Range Status  06/19/2021 36.1 35.0 - 45.0 % Final         Passed - AST in normal range and within 360 days    AST  Date Value Ref Range Status  12/29/2021 13 10 - 35 U/L Final         Passed - ALT in normal range and within 360 days    ALT  Date Value Ref Range Status  12/29/2021 14 6 - 29 U/L Final         Passed - eGFR is 30 or above and within 360 days    GFR, Est African American  Date Value Ref Range Status  05/23/2019 113 > OR = 60 mL/min/1.70m Final   GFR, Est Non African American  Date Value Ref Range Status  05/23/2019 98 > OR = 60 mL/min/1.779mFinal   eGFR  Date Value Ref Range Status  12/29/2021 75 > OR = 60 mL/min/1.7380minal         Passed - Patient is not pregnant      Passed - Valid encounter within last 12 months    Recent Outpatient Visits           3 weeks ago Osteoarthritis of left knee, unspecified osteoarthritis type   [place]   3 months ago Type 2 diabetes mellitus with stage 3a chronic kidney disease, without long-term current use of  insulin ([facility]   6 months ago Type 2 diabetes mellitus with stage 3a chronic kidney disease, without long-term current use of insulin ([facility]   9 months ago Encounter for general adult medical examination with abnormal findings   [facility]   1 year ago Primary osteoarthritis of left knee   [facility]       Future Appointments             In 2 months [facility]

## 2022-04-17 ENCOUNTER — Ambulatory Visit: Payer: 59

## 2022-04-17 DIAGNOSIS — M25562 Pain in left knee: Secondary | ICD-10-CM | POA: Diagnosis not present

## 2022-04-17 DIAGNOSIS — G8929 Other chronic pain: Secondary | ICD-10-CM

## 2022-04-17 DIAGNOSIS — M6281 Muscle weakness (generalized): Secondary | ICD-10-CM

## 2022-04-17 NOTE — Therapy (Signed)
OUTPATIENT PHYSICAL THERAPY KNEE TREATMENT  Patient Name: Darlene Mcdaniel MRN: [id number] DOB:[birth year], 72 y.o., female Today's Date: 04/17/2022   PT End of Session - 04/17/22 1105     Visit Number 3    Number of Visits 13    Date for PT Re-Evaluation 06/02/22    Authorization Type eval: 04/07/22    PT Start Time 1055    PT Stop Time 1140    PT Time Calculation (min) 45 min    Activity Tolerance Patient tolerated treatment well    Behavior During Therapy [facility] for tasks assessed/performed            Past Medical History:  Diagnosis Date   Hyperlipidemia    Hypertension    Past Surgical History:  Procedure Laterality Date   NO PAST SURGERIES     Patient Active Problem List   Diagnosis Date Noted   DM (diabetes mellitus), type 2 ([place]) 09/24/2021   GERD (gastroesophageal reflux disease) 09/24/2021   Aortic atherosclerosis ([place]) 06/19/2021   Pulmonary emphysema ([place]) 06/19/2021   Mixed incontinence 03/10/2021   Class 1 obesity due to excess calories with body mass index (BMI) of 31.0 to 31.9 in adult 02/13/2021   Osteoarthritis of left knee 06/18/2020   Obstructive sleep apnea 07/18/2019   Hyperlipidemia 06/21/2019   Hypertension 05/23/2019   PCP: Waylan Rocher, MD  REFERRING PROVIDER: Montel Culver, MD  REFERRING DIAGNOSIS: M17.12 (ICD-10-CM) - Osteoarthritis of left knee, unspecified osteoarthritis type   THERAPY DIAG: Chronic pain of left knee  Muscle weakness (generalized)  RATIONALE FOR EVALUATION AND TREATMENT: Rehabilitation  ONSET DATE: 03/2020 (approximate)  FOLLOW UP APPT WITH PROVIDER: Yes, 06/16/22  FROM INITIAL EVALUATION (04/07/22) SUBJECTIVE:                                                                                                                                                                                         Chief Complaint: Chronic L knee pain;  Pertinent History Acute on chronic left knee pain in the setting of  osteoarthritis. Previously seen in physical therapy for this issue in 2023.  Underwent Zilretta injection in L knee on 03/18/22 and was started on a brief course of Celebrex. At the time of PT evaluation L knee pain has resolved. Pt would like to work on strengthening in order to prevent pain from returning.  History from PT evaluation 03/28/21: Pt has history of L knee pain that started ~1 year ago and began gradually getting worse. ~6 weeks ago pain got so intense that the throbbing would wake her up from her sleep in the middle of the night. Went to go see  her GP who referred her to Dr. Zigmund Daniel who gave her an injection in her knee on 02/24/21. Since the injection her knee pain has significantly decreased, but she has been experiencing some buckling in her knee. She reports no falls with the buckling. Pt reports still occasionally feeling the pain in her L knee, but no where near as bad as it had been before the injection. Prior to the injection the pt stated that she was an entirely different person due to the amount of pain she was in. Prior to the injection the pain at its worst would reach a 10/10 on the NPRS. Pt reports the pain was constant and throbbing. Pt stated that in the mornings her knee would be stiff and painful, but after she took a hot shower and started moving it would ease up, then after some activity the pain would increase again. To ease the pain the pt mainly just tried to stay off of the leg. Pt stated that when she did have to walk she "walked like a rocking chair". Pt still can not walk for long durations, but she reports that it is more due to fatigue than pain. Pt has no previous back, hip, knee or ankle injuries that she can recall. Pt describes herself as sedentary and does no perform much physical activity. Pt enjoys reading and would like to be able to walk around [place] and the grocery store. Pt lives in a single level home with no stairs.  Pain:  Pain Intensity: Present: 0/10,  Best: 0/10, Worst: 0/10 (no pain s/p injection); Pain location: No pain currently but prior to injection generalized L knee pain; Pain quality: aching and throbbing   Radiating pain: No  Swelling: No  Popping, catching, locking: Yes, was catching prior to injection Numbness/Tingling: No Focal weakness or buckling: Yes, occasional buckling Aggravating factors: extended sitting and standing, "squatting is not even an option" Relieving factors: Zilretta injection, Voltaren  24-hour pain behavior: Varied based on activity History of prior back, hip, or knee injury, pain, surgery, or therapy: No Falls: Has patient fallen in last 6 months? No, Dominant hand: right Imaging: Yes, IMPRESSION: 1. Tricompartmental osteoarthritis, most prominently affecting the patellofemoral compartment. 2. Valgus angulation. Lateral patellar tilt. Prior level of function: Independent Occupational demands: Retired Office manager: [employer] Red flags: Negative for chills/fever, night sweats, nausea, vomiting, unrelenting pain;  Precautions: None  Weight Bearing Restrictions: No  Living Environment Lives with: lives alone Lives in: House/apartment, no steps; uses a cane occasionally when knee is hurting  Patient Goals: Prevent L knee pain from returning and strengthen her LLE;  OBJECTIVE:   Patient Surveys  FOTO 51, predicted improvement to 70  Cognition Patient is oriented to person, place, and time.  Recent memory is intact.  Remote memory is intact.  Attention span and concentration are intact.  Expressive speech is intact.  Patient's fund of knowledge is within normal limits for educational level.    Gross Musculoskeletal Assessment Tremor: None Bulk: Normal Tone: Normal No visible step-off along spinal column, no signs of scoliosis  GAIT: Mildly antalgic gait noted with varus angulation noted in L knee. Decreased self-selected gait speed.  Assistive device utilized: None Level of assistance:  Complete Independence  Posture: Full posture screening deferred;  AROM AROM (Normal range in degrees) AROM  04/07/2022  Hip Right Left  Flexion (125) WNL WNL  Extension (15) WNL WNL  Abduction (40)    Adduction     Internal Rotation (45) WNL WNL  External  Rotation (45) WNL WNL      Knee    Flexion (135) WNL WNL* (pain with overpressure)  Extension (0) WNL WNL* (pain with overpressure)  (* = pain; Blank rows = not tested)  LE MMT: MMT (out of 5) Right 04/07/2022 Left 04/07/2022  Hip flexion 4+ 4  Hip extension 3+ 3+  Hip abduction 3+ 3+  Hip adduction 4 4  Hip internal rotation    Hip external rotation    Knee flexion 4 3+  Knee extension 5 4*  Ankle dorsiflexion 5 4* (pain in knee)  (* = pain; Blank rows = not tested)  Sensation Deferred  Reflexes Deferred  Muscle Length Hamstrings: R: Not examined L: Negative (at least 90 degrees) Quadriceps Pat Patrick): R: Shortened L: Shortened  Palpation Location LEFT  RIGHT           Quadriceps 0   Medial Hamstrings 0   Lateral Hamstrings 0   Lateral Hamstring tendon 0   Medial Hamstring tendon 0   Quadriceps tendon 0   Patella 0   Patellar Tendon 1   Tibial Tuberosity 0   Medial joint line 2   Lateral joint line 0   MCL 2   LCL 0   Adductor Tubercle 0   Pes Anserine tendon 1   Infrapatellar fat pad    Fibular head 0   Popliteal fossa 0   (Blank rows = not tested) Graded on 0-4 scale (0 = no pain, 1 = pain, 2 = pain with wincing/grimacing/flinching, 3 = pain with withdrawal, 4 = unwilling to allow palpation), (Blank rows = not tested)  Passive Accessory Motion Deferred  VASCULAR Dorsalis pedis and posterior tibial pulses are palpable  SPECIAL TESTS  Ligamentous Stability  ACL: Lachman's: R: Not examined L: Negative Anterior Drawer: R: Not examined L: Negative  PCL: Posterior Drawer: R: Not examined L: Negative Reverse Lachman's: R: Not examined L: Negative Posterior Sag Sign: R: Not examined L:  Negative  MCL: Valgus Stress (30 degrees flexion): R: Not examined L: Negative  LCL: Varus Stress (30 degrees flexion): R: Not examined L: Negative  Meniscus Tests McMurray's Test:  Medial Meniscus (Tibial ER): R: Not examined L: Negative Lateral Meniscus (Tibial IR): R: Not examined L: Negative  Thessaly: R: Not examined L: Not examined Ege's: R: Not examined L: Not examined  Patellofemoral Pain Syndrome Patellar Tilt (Lateral): R: Not tested L: Negative Patellar Apprehension: R: Not tested L: Positive Squatting pain: R: Not examined L: Positive Stair climbing pain: R: Not examined L: Not examined Kneeling pain: R: Not examined L: Not examined Resisted knee extension pain: R: Not examined L: Positive Compression: R: Not examined L: Positive Clarke's sign: R: Not examined L: Positive Lateral Pull: R: Not examined L: Not examined  Patellar Tendinopathy Inferior pole palpation with anterior tilt: R: Not examined L: Negative     TODAY'S TREATMENT    SUBJECTIVE: Pt reports that she is doing alright but is having a lot of L knee soreness/pain upon arrival. She rates it as 5/10 currently. She was able to perform her HEP since the last therapy session. No specific questions.   PAIN: 5/10 L knee pain   Ther-ex  NuStep L1-3 x 6 minutes for LE strengthening, warm-up, and while obtaining interval history, therapist adjusting resistance; Supine L SLR with 3# ankle weight (AW) 3 x 10; Supine L SAQ over bolster with heavy manual resistance 3 x 10; Hooklying bolster bridges to minimize WB through L knee 3 x 10;  Hooklying clams with manual resistance 3 x 10; Hooklying adductor ball squeezes 3s hold 3 x 10;  R sidelying L hip abduction with 3# AW 3 x 10; R sidelying L hip reverese clam with 3# AW 3 x 10; Prone L hamstring curls with 3# AW 3 x 10; Prone L hip extension with 3# AW 3 x 10;   Not performed: Supine L quad set over towel roll 5s hold x 10; 6" forward step-ups leading  with LUE up/RLE down  x 10; 6" L lateral step-ups x 10; Total Gym (TG) Level 22 (L22) double leg squats 2 x 15; TG L22 2 x 15; Green tband resisted side stepping (band around knees) 2 x 4 lengths; Seated LAQ with 4# ankle weights 2 x 10 BLE;   PATIENT EDUCATION:  Education details: Pt educated throughout session about proper posture and technique with exercises. Improved exercise technique, movement at target joints, use of target muscles after min to mod verbal, visual, tactile cues.  Person educated: Patient Education method: Explanation, verbal cues Education comprehension: verbalized understanding and returned demonstration;   HOME EXERCISE PROGRAM: Access Code: [email or web address] URL: [email or web address] Date: 04/07/2022 Prepared by: Roxana Hires  Exercises - Supine Quad Set  - 1 x daily - 7 x weekly - 2-3 sets - 10 reps - 3s hold - Supine Active Straight Leg Raise (Mirrored)  - 1 x daily - 7 x weekly - 2-3 sets - 10 reps - 3s hold - Bridge with Hip Abduction and Resistance  - 1 x daily - 7 x weekly - 2-3 sets - 10 reps - 3s hold - Sidelying Hip Abduction (Mirrored)  - 1 x daily - 7 x weekly - 2-3 sets - 10 reps - 3s hold   ASSESSMENT:  CLINICAL IMPRESSION: Progressed strengthening with patient during session today. Focused on mostly NWB/open chain exercises due to increase in L knee pain upon arrival today. She denies any increase in knee pain throughout session but exercises are challenging. Plan to progress strengthening during future sessions and return to more functional seated and standing positions. Will progress HEP once pt is performing consistently and pain is lessened. Pt will benefit from PT services to address deficits in strength, pain, and mobility in order to return to full function at home with less pain.  OBJECTIVE IMPAIRMENTS: Abnormal gait, difficulty walking, and pain.   ACTIVITY LIMITATIONS: sitting, standing, squatting, and walking extended  distances  PARTICIPATION LIMITATIONS: community activity  PERSONAL FACTORS: Age, Past/current experiences, Time since onset of injury/illness/exacerbation, and 3+ comorbidities: OA, DM, and emphysema are also affecting patient's functional outcome.   REHAB POTENTIAL: Excellent  CLINICAL DECISION MAKING: Stable/uncomplicated  EVALUATION COMPLEXITY: Low   GOALS: Goals reviewed with patient? Yes  SHORT TERM GOALS: Target date: 04/28/2022  Pt will be independent with HEP to improve strength and decrease knee pain to improve pain-free function at home Baseline:  Goal status: INITIAL   LONG TERM GOALS: Target date: 05/19/2022  Pt will increase FOTO to at least 58 to demonstrate significant improvement in function at home and work related to knee pain  Baseline: 04/07/22: 51; Goal status: INITIAL  2.  Pt will increase strength of pain-free L knee extension and L hip abduction by at least 1/2 MMT grade in order to demonstrate improvement in strength and function  Baseline: 04/07/22: L knee extension: 4/5, painful; L hip abduction: 3+/5, no pain; Goal status: INITIAL  3.  Pt will report at least 75% improvement in her L knee symptoms  in order demonstrate clinically significant reduction in knee pain/disability.       Baseline:  Goal status: INITIAL   PLAN: PT FREQUENCY: 1-2x/week  PT DURATION: 6 weeks  PLANNED INTERVENTIONS: Therapeutic exercises, Therapeutic activity, Neuromuscular re-education, Balance training, Gait training, Patient/Family education, Joint manipulation, Joint mobilization, Canalith repositioning, Aquatic Therapy, Dry Needling, Electrical stimulation, Spinal manipulation, Spinal mobilization, Cryotherapy, Moist heat, Taping, Traction, Ultrasound, Ionotophoresis 4mg /ml Dexamethasone, and Manual therapy  PLAN FOR NEXT SESSION: Review/modify HEP as needed, progress LLE strengthening;   Lyndel Safe Shakhia Gramajo PT, DPT, GCS  Brie Eppard 04/17/2022, 11:49 AM

## 2022-04-20 NOTE — Therapy (Signed)
OUTPATIENT PHYSICAL THERAPY KNEE TREATMENT  Patient Name: Darlene Mcdaniel MRN: [id number] DOB:January 03, 1951, 72 y.o., female Today's Date: 04/22/2022   PT End of Session - 04/22/22 1157     Visit Number 4    Number of Visits 13    Date for PT Re-Evaluation 06/02/22    Authorization Type eval: 04/07/22    PT Start Time 1152    PT Stop Time 1230    PT Time Calculation (min) 38 min    Activity Tolerance Patient tolerated treatment well    Behavior During Therapy [facility] for tasks assessed/performed            Past Medical History:  Diagnosis Date   Hyperlipidemia    Hypertension    Past Surgical History:  Procedure Laterality Date   NO PAST SURGERIES     Patient Active Problem List   Diagnosis Date Noted   DM (diabetes mellitus), type 2 ([place]) 09/24/2021   GERD (gastroesophageal reflux disease) 09/24/2021   Aortic atherosclerosis ([place]) 06/19/2021   Pulmonary emphysema ([place]) 06/19/2021   Mixed incontinence 03/10/2021   Class 1 obesity due to excess calories with body mass index (BMI) of 31.0 to 31.9 in adult 02/13/2021   Osteoarthritis of left knee 06/18/2020   Obstructive sleep apnea 07/18/2019   Hyperlipidemia 06/21/2019   Hypertension 05/23/2019   PCP: Waylan Rocher, MD  REFERRING PROVIDER: Montel Culver, MD  REFERRING DIAGNOSIS: M17.12 (ICD-10-CM) - Osteoarthritis of left knee, unspecified osteoarthritis type   THERAPY DIAG: Chronic pain of left knee  Muscle weakness (generalized)  RATIONALE FOR EVALUATION AND TREATMENT: Rehabilitation  ONSET DATE: 03/2020 (approximate)  FOLLOW UP APPT WITH PROVIDER: Yes, 06/16/22  FROM INITIAL EVALUATION (04/07/22) SUBJECTIVE:                                                                                                                                                                                         Chief Complaint: Chronic L knee pain;  Pertinent History Acute on chronic left knee pain in the setting of  osteoarthritis. Previously seen in physical therapy for this issue in 2023.  Underwent Zilretta injection in L knee on 03/18/22 and was started on a brief course of Celebrex. At the time of PT evaluation L knee pain has resolved. Pt would like to work on strengthening in order to prevent pain from returning.  History from PT evaluation 03/28/21: Pt has history of L knee pain that started ~1 year ago and began gradually getting worse. ~6 weeks ago pain got so intense that the throbbing would wake her up from her sleep in the middle of the night. Went to go see  her GP who referred her to Dr. Zigmund Daniel who gave her an injection in her knee on 02/24/21. Since the injection her knee pain has significantly decreased, but she has been experiencing some buckling in her knee. She reports no falls with the buckling. Pt reports still occasionally feeling the pain in her L knee, but no where near as bad as it had been before the injection. Prior to the injection the pt stated that she was an entirely different person due to the amount of pain she was in. Prior to the injection the pain at its worst would reach a 10/10 on the NPRS. Pt reports the pain was constant and throbbing. Pt stated that in the mornings her knee would be stiff and painful, but after she took a hot shower and started moving it would ease up, then after some activity the pain would increase again. To ease the pain the pt mainly just tried to stay off of the leg. Pt stated that when she did have to walk she "walked like a rocking chair". Pt still can not walk for long durations, but she reports that it is more due to fatigue than pain. Pt has no previous back, hip, knee or ankle injuries that she can recall. Pt describes herself as sedentary and does no perform much physical activity. Pt enjoys reading and would like to be able to walk around [place] and the grocery store. Pt lives in a single level home with no stairs.  Pain:  Pain Intensity: Present: 0/10,  Best: 0/10, Worst: 0/10 (no pain s/p injection); Pain location: No pain currently but prior to injection generalized L knee pain; Pain quality: aching and throbbing   Radiating pain: No  Swelling: No  Popping, catching, locking: Yes, was catching prior to injection Numbness/Tingling: No Focal weakness or buckling: Yes, occasional buckling Aggravating factors: extended sitting and standing, "squatting is not even an option" Relieving factors: Zilretta injection, Voltaren  24-hour pain behavior: Varied based on activity History of prior back, hip, or knee injury, pain, surgery, or therapy: No Falls: Has patient fallen in last 6 months? No, Dominant hand: right Imaging: Yes, IMPRESSION: 1. Tricompartmental osteoarthritis, most prominently affecting the patellofemoral compartment. 2. Valgus angulation. Lateral patellar tilt. Prior level of function: Independent Occupational demands: Retired Office manager: [employer] Red flags: Negative for chills/fever, night sweats, nausea, vomiting, unrelenting pain;  Precautions: None  Weight Bearing Restrictions: No  Living Environment Lives with: lives alone Lives in: House/apartment, no steps; uses a cane occasionally when knee is hurting  Patient Goals: Prevent L knee pain from returning and strengthen her LLE;  OBJECTIVE:   Patient Surveys  FOTO 51, predicted improvement to 10  Cognition Patient is oriented to person, place, and time.  Recent Darlene is intact.  Remote Darlene is intact.  Attention span and concentration are intact.  Expressive speech is intact.  Patient's fund of knowledge is within normal limits for educational level.    Gross Musculoskeletal Assessment Tremor: None Bulk: Normal Tone: Normal No visible step-off along spinal column, no signs of scoliosis  GAIT: Mildly antalgic gait noted with varus angulation noted in L knee. Decreased self-selected gait speed.  Assistive device utilized: None Level of assistance:  Complete Independence  AROM AROM (Normal range in degrees) AROM  04/07/2022  Hip Right Left  Flexion (125) WNL WNL  Extension (15) WNL WNL  Abduction (40)    Adduction     Internal Rotation (45) WNL WNL  External Rotation (45) WNL WNL  Knee    Flexion (135) WNL WNL* (pain with overpressure)  Extension (0) WNL WNL* (pain with overpressure)  (* = pain; Blank rows = not tested)  LE MMT: MMT (out of 5) Right 04/07/2022 Left 04/07/2022  Hip flexion 4+ 4  Hip extension 3+ 3+  Hip abduction 3+ 3+  Hip adduction 4 4  Hip internal rotation    Hip external rotation    Knee flexion 4 3+  Knee extension 5 4*  Ankle dorsiflexion 5 4* (pain in knee)  (* = pain; Blank rows = not tested)  Muscle Length Hamstrings: R: Not examined L: Negative (at least 90 degrees) Quadriceps Pat Patrick): R: Shortened L: Shortened  Palpation Location LEFT  RIGHT           Quadriceps 0   Medial Hamstrings 0   Lateral Hamstrings 0   Lateral Hamstring tendon 0   Medial Hamstring tendon 0   Quadriceps tendon 0   Patella 0   Patellar Tendon 1   Tibial Tuberosity 0   Medial joint line 2   Lateral joint line 0   MCL 2   LCL 0   Adductor Tubercle 0   Pes Anserine tendon 1   Infrapatellar fat pad    Fibular head 0   Popliteal fossa 0   (Blank rows = not tested) Graded on 0-4 scale (0 = no pain, 1 = pain, 2 = pain with wincing/grimacing/flinching, 3 = pain with withdrawal, 4 = unwilling to allow palpation), (Blank rows = not tested)  VASCULAR Dorsalis pedis and posterior tibial pulses are palpable  SPECIAL TESTS  Ligamentous Stability  ACL: Lachman's: R: Not examined L: Negative Anterior Drawer: R: Not examined L: Negative  PCL: Posterior Drawer: R: Not examined L: Negative Reverse Lachman's: R: Not examined L: Negative Posterior Sag Sign: R: Not examined L: Negative  MCL: Valgus Stress (30 degrees flexion): R: Not examined L: Negative  LCL: Varus Stress (30 degrees flexion): R: Not  examined L: Negative  Meniscus Tests McMurray's Test:  Medial Meniscus (Tibial ER): R: Not examined L: Negative Lateral Meniscus (Tibial IR): R: Not examined L: Negative  Thessaly: R: Not examined L: Not examined Ege's: R: Not examined L: Not examined  Patellofemoral Pain Syndrome Patellar Tilt (Lateral): R: Not tested L: Negative Patellar Apprehension: R: Not tested L: Positive Squatting pain: R: Not examined L: Positive Stair climbing pain: R: Not examined L: Not examined Kneeling pain: R: Not examined L: Not examined Resisted knee extension pain: R: Not examined L: Positive Compression: R: Not examined L: Positive Clarke's sign: R: Not examined L: Positive Lateral Pull: R: Not examined L: Not examined  Patellar Tendinopathy Inferior pole palpation with anterior tilt: R: Not examined L: Negative     TODAY'S TREATMENT    SUBJECTIVE: Pt reports that she is doing alright but is continuing with some L knee soreness/pain upon arrival. She rates it as 4/10 currently. She was able to perform some of her HEP since the last therapy session. No specific questions.   PAIN: 4/10 L knee pain   Ther-ex  NuStep L1-3 x 6 minutes for LE strengthening, warm-up, and while obtaining interval history, therapist adjusting resistance; Total Gym (TG) Level 22 (L22) double leg squats x 10, due to increase in knee pain ; TG L22 heel raises x 10; Standing exercises with 4# ankle weight (AW): Hip flexion marches x 10 BLE; Hamstring curls x 10 BLE; Hip abduction x 10 BLE; Hip extension x 10 BLE;  Seated  LAQ with 4# AW x 10; Seated clams with blue tband 2 x 10; Seated adductor ball squeeze 3s hold 2 x 10; Supine L SLR with 3# ankle weight (AW) 2 x 10; Supine L SAQ over bolster with heavy manual resistance 2 x 10;   Not performed: Supine L quad set over towel roll 5s hold x 10; 6" forward step-ups leading with LUE up/RLE down  x 10; 6" L lateral step-ups x 10; Green tband resisted side  stepping (band around knees) 2 x 4 lengths; Seated LAQ with 4# ankle weights 2 x 10 BLE; Hooklying bolster bridges to minimize WB through L knee 3 x 10; R sidelying L hip abduction with 3# AW 3 x 10; R sidelying L hip reverese clam with 3# AW 3 x 10; Prone L hamstring curls with 3# AW 3 x 10; Prone L hip extension with 3# AW 3 x 10;   PATIENT EDUCATION:  Education details: Pt educated throughout session about proper posture and technique with exercises. Improved exercise technique, movement at target joints, use of target muscles after min to mod verbal, visual, tactile cues.  Person educated: Patient Education method: Explanation, verbal cues Education comprehension: verbalized understanding and returned demonstration;   HOME EXERCISE PROGRAM: Access Code: [email or web address] URL: [email or web address] Date: 04/07/2022 Prepared by: Roxana Hires  Exercises - Supine Quad Set  - 1 x daily - 7 x weekly - 2-3 sets - 10 reps - 3s hold - Supine Active Straight Leg Raise (Mirrored)  - 1 x daily - 7 x weekly - 2-3 sets - 10 reps - 3s hold - Bridge with Hip Abduction and Resistance  - 1 x daily - 7 x weekly - 2-3 sets - 10 reps - 3s hold - Sidelying Hip Abduction (Mirrored)  - 1 x daily - 7 x weekly - 2-3 sets - 10 reps - 3s hold   ASSESSMENT:  CLINICAL IMPRESSION: Progressed strengthening with patient during session today. Reintroduced some WB exercises during session. She reports some pain on the Total Gym so only one set of squats performed. At the end of session completed some NWB/open chain exercises to minimize excessive increase in knee pain. Plan to progress strengthening during future sessions in more functional seated and standing positions as pt will allow. Will progress HEP once pt is performing consistently and pain is lessened. Pt will benefit from PT services to address deficits in strength, pain, and mobility in order to return to full function at home with less  pain.  OBJECTIVE IMPAIRMENTS: Abnormal gait, difficulty walking, and pain.   ACTIVITY LIMITATIONS: sitting, standing, squatting, and walking extended distances  PARTICIPATION LIMITATIONS: community activity  PERSONAL FACTORS: Age, Past/current experiences, Time since onset of injury/illness/exacerbation, and 3+ comorbidities: OA, DM, and emphysema are also affecting patient's functional outcome.   REHAB POTENTIAL: Excellent  CLINICAL DECISION MAKING: Stable/uncomplicated  EVALUATION COMPLEXITY: Low   GOALS: Goals reviewed with patient? Yes  SHORT TERM GOALS: Target date: 04/28/2022  Pt will be independent with HEP to improve strength and decrease knee pain to improve pain-free function at home Baseline:  Goal status: INITIAL   LONG TERM GOALS: Target date: 05/19/2022  Pt will increase FOTO to at least 58 to demonstrate significant improvement in function at home and work related to knee pain  Baseline: 04/07/22: 51; Goal status: INITIAL  2.  Pt will increase strength of pain-free L knee extension and L hip abduction by at least 1/2 MMT grade in order to demonstrate improvement in strength  and function  Baseline: 04/07/22: L knee extension: 4/5, painful; L hip abduction: 3+/5, no pain; Goal status: INITIAL  3.  Pt will report at least 75% improvement in her L knee symptoms in order demonstrate clinically significant reduction in knee pain/disability.       Baseline:  Goal status: INITIAL   PLAN: PT FREQUENCY: 1-2x/week  PT DURATION: 6 weeks  PLANNED INTERVENTIONS: Therapeutic exercises, Therapeutic activity, Neuromuscular re-education, Balance training, Gait training, Patient/Family education, Joint manipulation, Joint mobilization, Canalith repositioning, Aquatic Therapy, Dry Needling, Electrical stimulation, Spinal manipulation, Spinal mobilization, Cryotherapy, Moist heat, Taping, Traction, Ultrasound, Ionotophoresis 4mg /ml Dexamethasone, and Manual therapy  PLAN FOR NEXT  SESSION: Review/modify HEP as needed, progress LLE strengthening;   Lyndel Safe Nadia Viar PT, DPT, GCS  Brodey Bonn 04/22/2022, 1:25 PM

## 2022-04-22 ENCOUNTER — Ambulatory Visit: Payer: 59

## 2022-04-22 DIAGNOSIS — G8929 Other chronic pain: Secondary | ICD-10-CM

## 2022-04-22 DIAGNOSIS — M25562 Pain in left knee: Secondary | ICD-10-CM | POA: Diagnosis not present

## 2022-04-22 DIAGNOSIS — M6281 Muscle weakness (generalized): Secondary | ICD-10-CM

## 2022-04-24 ENCOUNTER — Ambulatory Visit: Payer: 59

## 2022-04-24 DIAGNOSIS — M6281 Muscle weakness (generalized): Secondary | ICD-10-CM

## 2022-04-24 DIAGNOSIS — G8929 Other chronic pain: Secondary | ICD-10-CM

## 2022-04-24 DIAGNOSIS — M25562 Pain in left knee: Secondary | ICD-10-CM | POA: Diagnosis not present

## 2022-04-24 NOTE — Therapy (Signed)
OUTPATIENT PHYSICAL THERAPY KNEE TREATMENT  Patient Name: Darlene Mcdaniel MRN: [id number] DOB:1950-08-13, 72 y.o., female Today's Date: 04/24/2022   PT End of Session - 04/24/22 1103     Visit Number 5    Number of Visits 13    Date for PT Re-Evaluation 06/02/22    Authorization Type eval: 04/07/22    PT Start Time 1100    PT Stop Time 1145    PT Time Calculation (min) 45 min    Activity Tolerance Patient tolerated treatment well    Behavior During Therapy [facility] for tasks assessed/performed            Past Medical History:  Diagnosis Date   Hyperlipidemia    Hypertension    Past Surgical History:  Procedure Laterality Date   NO PAST SURGERIES     Patient Active Problem List   Diagnosis Date Noted   DM (diabetes mellitus), type 2 ([place]) 09/24/2021   GERD (gastroesophageal reflux disease) 09/24/2021   Aortic atherosclerosis ([place]) 06/19/2021   Pulmonary emphysema ([place]) 06/19/2021   Mixed incontinence 03/10/2021   Class 1 obesity due to excess calories with body mass index (BMI) of 31.0 to 31.9 in adult 02/13/2021   Osteoarthritis of left knee 06/18/2020   Obstructive sleep apnea 07/18/2019   Hyperlipidemia 06/21/2019   Hypertension 05/23/2019   PCP: Waylan Rocher, MD  REFERRING PROVIDER: Montel Culver, MD  REFERRING DIAGNOSIS: M17.12 (ICD-10-CM) - Osteoarthritis of left knee, unspecified osteoarthritis type   THERAPY DIAG: Chronic pain of left knee  Muscle weakness (generalized)  RATIONALE FOR EVALUATION AND TREATMENT: Rehabilitation  ONSET DATE: 03/2020 (approximate)  FOLLOW UP APPT WITH PROVIDER: Yes, 06/16/22  FROM INITIAL EVALUATION (04/07/22) SUBJECTIVE:                                                                                                                                                                                         Chief Complaint: Chronic L knee pain;  Pertinent History Acute on chronic left knee pain in the setting of  osteoarthritis. Previously seen in physical therapy for this issue in 2023.  Underwent Zilretta injection in L knee on 03/18/22 and was started on a brief course of Celebrex. At the time of PT evaluation L knee pain has resolved. Pt would like to work on strengthening in order to prevent pain from returning.  History from PT evaluation 03/28/21: Pt has history of L knee pain that started ~1 year ago and began gradually getting worse. ~6 weeks ago pain got so intense that the throbbing would wake her up from her sleep in the middle of the night. Went to go see  her GP who referred her to Dr. Zigmund Daniel who gave her an injection in her knee on 02/24/21. Since the injection her knee pain has significantly decreased, but she has been experiencing some buckling in her knee. She reports no falls with the buckling. Pt reports still occasionally feeling the pain in her L knee, but no where near as bad as it had been before the injection. Prior to the injection the pt stated that she was an entirely different person due to the amount of pain she was in. Prior to the injection the pain at its worst would reach a 10/10 on the NPRS. Pt reports the pain was constant and throbbing. Pt stated that in the mornings her knee would be stiff and painful, but after she took a hot shower and started moving it would ease up, then after some activity the pain would increase again. To ease the pain the pt mainly just tried to stay off of the leg. Pt stated that when she did have to walk she "walked like a rocking chair". Pt still can not walk for long durations, but she reports that it is more due to fatigue than pain. Pt has no previous back, hip, knee or ankle injuries that she can recall. Pt describes herself as sedentary and does no perform much physical activity. Pt enjoys reading and would like to be able to walk around [place] and the grocery store. Pt lives in a single level home with no stairs.  Pain:  Pain Intensity: Present: 0/10,  Best: 0/10, Worst: 0/10 (no pain s/p injection); Pain location: No pain currently but prior to injection generalized L knee pain; Pain quality: aching and throbbing   Radiating pain: No  Swelling: No  Popping, catching, locking: Yes, was catching prior to injection Numbness/Tingling: No Focal weakness or buckling: Yes, occasional buckling Aggravating factors: extended sitting and standing, "squatting is not even an option" Relieving factors: Zilretta injection, Voltaren  24-hour pain behavior: Varied based on activity History of prior back, hip, or knee injury, pain, surgery, or therapy: No Falls: Has patient fallen in last 6 months? No, Dominant hand: right Imaging: Yes, IMPRESSION: 1. Tricompartmental osteoarthritis, most prominently affecting the patellofemoral compartment. 2. Valgus angulation. Lateral patellar tilt. Prior level of function: Independent Occupational demands: Retired Office manager: Reading Red flags: Negative for chills/fever, night sweats, nausea, vomiting, unrelenting pain;  Precautions: None  Weight Bearing Restrictions: No  Living Environment Lives with: lives alone Lives in: House/apartment, no steps; uses a cane occasionally when knee is hurting  Patient Goals: Prevent L knee pain from returning and strengthen her LLE;  OBJECTIVE:   Patient Surveys  FOTO 51, predicted improvement to 55  Cognition Patient is oriented to person, place, and time.  Recent memory is intact.  Remote memory is intact.  Attention span and concentration are intact.  Expressive speech is intact.  Patient's fund of knowledge is within normal limits for educational level.    Gross Musculoskeletal Assessment Tremor: None Bulk: Normal Tone: Normal No visible step-off along spinal column, no signs of scoliosis  GAIT: Mildly antalgic gait noted with varus angulation noted in L knee. Decreased self-selected gait speed.  Assistive device utilized: None Level of assistance:  Complete Independence  AROM AROM (Normal range in degrees) AROM  04/07/2022  Hip Right Left  Flexion (125) WNL WNL  Extension (15) WNL WNL  Abduction (40)    Adduction     Internal Rotation (45) WNL WNL  External Rotation (45) WNL WNL  Knee    Flexion (135) WNL WNL* (pain with overpressure)  Extension (0) WNL WNL* (pain with overpressure)  (* = pain; Blank rows = not tested)  LE MMT: MMT (out of 5) Right 04/07/2022 Left 04/07/2022  Hip flexion 4+ 4  Hip extension 3+ 3+  Hip abduction 3+ 3+  Hip adduction 4 4  Hip internal rotation    Hip external rotation    Knee flexion 4 3+  Knee extension 5 4*  Ankle dorsiflexion 5 4* (pain in knee)  (* = pain; Blank rows = not tested)  Muscle Length Hamstrings: R: Not examined L: Negative (at least 90 degrees) Quadriceps Pat Patrick): R: Shortened L: Shortened  Palpation Location LEFT  RIGHT           Quadriceps 0   Medial Hamstrings 0   Lateral Hamstrings 0   Lateral Hamstring tendon 0   Medial Hamstring tendon 0   Quadriceps tendon 0   Patella 0   Patellar Tendon 1   Tibial Tuberosity 0   Medial joint line 2   Lateral joint line 0   MCL 2   LCL 0   Adductor Tubercle 0   Pes Anserine tendon 1   Infrapatellar fat pad    Fibular head 0   Popliteal fossa 0   (Blank rows = not tested) Graded on 0-4 scale (0 = no pain, 1 = pain, 2 = pain with wincing/grimacing/flinching, 3 = pain with withdrawal, 4 = unwilling to allow palpation), (Blank rows = not tested)  VASCULAR Dorsalis pedis and posterior tibial pulses are palpable  SPECIAL TESTS  Ligamentous Stability  ACL: Lachman's: R: Not examined L: Negative Anterior Drawer: R: Not examined L: Negative  PCL: Posterior Drawer: R: Not examined L: Negative Reverse Lachman's: R: Not examined L: Negative Posterior Sag Sign: R: Not examined L: Negative  MCL: Valgus Stress (30 degrees flexion): R: Not examined L: Negative  LCL: Varus Stress (30 degrees flexion): R: Not  examined L: Negative  Meniscus Tests McMurray's Test:  Medial Meniscus (Tibial ER): R: Not examined L: Negative Lateral Meniscus (Tibial IR): R: Not examined L: Negative  Thessaly: R: Not examined L: Not examined Ege's: R: Not examined L: Not examined  Patellofemoral Pain Syndrome Patellar Tilt (Lateral): R: Not tested L: Negative Patellar Apprehension: R: Not tested L: Positive Squatting pain: R: Not examined L: Positive Stair climbing pain: R: Not examined L: Not examined Kneeling pain: R: Not examined L: Not examined Resisted knee extension pain: R: Not examined L: Positive Compression: R: Not examined L: Positive Clarke's sign: R: Not examined L: Positive Lateral Pull: R: Not examined L: Not examined  Patellar Tendinopathy Inferior pole palpation with anterior tilt: R: Not examined L: Negative     TODAY'S TREATMENT    SUBJECTIVE: Denies L knee pain upon arrival. No changes since the last therapy session. No specific questions or concerns currently.   PAIN: Denies   Ther-ex  NuStep L1-3 x 6 minutes for LE strengthening, warm-up, and while obtaining interval history, therapist adjusting resistance; 6" forward step-ups leading with LUE up/RLE down  x 10; 6" L lateral step-ups x 10; Green tband resisted side stepping (band around knees) x 4 lengths;  Standing exercises with 5# ankle weight (AW): Hip flexion marches 2 x 10 BLE; Hamstring curls 2 x 10 BLE; Hip abduction 2 x 10 BLE; Hip extension 2 x 10 BLE;  Seated LAQ with 5# AW 2 x 10; Seated clams with blue tband 2 x 10;  Seated adductor ball squeeze 3s hold 2 x 10; Mini squats 2 x 10;   Not performed: Total Gym (TG) Level 22 (L22) double leg squats x 10, due to increase in knee pain ; TG L22 heel raises x 10; Supine L quad set over towel roll 5s hold x 10; Seated LAQ with 4# ankle weights 2 x 10 BLE; Hooklying bolster bridges to minimize WB through L knee 3 x 10; R sidelying L hip abduction with 3# AW 3 x  10; R sidelying L hip reverese clam with 3# AW 3 x 10; Prone L hamstring curls with 3# AW 3 x 10; Prone L hip extension with 3# AW 3 x 10; Supine L SLR with 3# ankle weight (AW) 2 x 10; Supine L SAQ over bolster with heavy manual resistance 2 x 10;   PATIENT EDUCATION:  Education details: Pt educated throughout session about proper posture and technique with exercises. Improved exercise technique, movement at target joints, use of target muscles after min to mod verbal, visual, tactile cues.  Person educated: Patient Education method: Explanation, verbal cues Education comprehension: verbalized understanding and returned demonstration;   HOME EXERCISE PROGRAM: Access Code: [email or web address] URL: [email or web address] Date: 04/07/2022 Prepared by: Roxana Hires  Exercises - Supine Quad Set  - 1 x daily - 7 x weekly - 2-3 sets - 10 reps - 3s hold - Supine Active Straight Leg Raise (Mirrored)  - 1 x daily - 7 x weekly - 2-3 sets - 10 reps - 3s hold - Bridge with Hip Abduction and Resistance  - 1 x daily - 7 x weekly - 2-3 sets - 10 reps - 3s hold - Sidelying Hip Abduction (Mirrored)  - 1 x daily - 7 x weekly - 2-3 sets - 10 reps - 3s hold   ASSESSMENT:  CLINICAL IMPRESSION: Progressed strengthening with patient during session today. Plan to progress strengthening during future sessions in more functional standing positions as pain will allow. Will progress HEP once pt is performing consistently and pain is lessened. Pt will benefit from PT services to address deficits in strength, pain, and mobility in order to return to full function at home with less pain.  OBJECTIVE IMPAIRMENTS: Abnormal gait, difficulty walking, and pain.   ACTIVITY LIMITATIONS: sitting, standing, squatting, and walking extended distances  PARTICIPATION LIMITATIONS: community activity  PERSONAL FACTORS: Age, Past/current experiences, Time since onset of injury/illness/exacerbation, and 3+ comorbidities:  OA, DM, and emphysema are also affecting patient's functional outcome.   REHAB POTENTIAL: Excellent  CLINICAL DECISION MAKING: Stable/uncomplicated  EVALUATION COMPLEXITY: Low   GOALS: Goals reviewed with patient? Yes  SHORT TERM GOALS: Target date: 04/28/2022  Pt will be independent with HEP to improve strength and decrease knee pain to improve pain-free function at home Baseline:  Goal status: INITIAL   LONG TERM GOALS: Target date: 05/19/2022  Pt will increase FOTO to at least 58 to demonstrate significant improvement in function at home and work related to knee pain  Baseline: 04/07/22: 51; Goal status: INITIAL  2.  Pt will increase strength of pain-free L knee extension and L hip abduction by at least 1/2 MMT grade in order to demonstrate improvement in strength and function  Baseline: 04/07/22: L knee extension: 4/5, painful; L hip abduction: 3+/5, no pain; Goal status: INITIAL  3.  Pt will report at least 75% improvement in her L knee symptoms in order demonstrate clinically significant reduction in knee pain/disability.       Baseline:  Goal status: INITIAL  PLAN: PT FREQUENCY: 1-2x/week  PT DURATION: 6 weeks  PLANNED INTERVENTIONS: Therapeutic exercises, Therapeutic activity, Neuromuscular re-education, Balance training, Gait training, Patient/Family education, Joint manipulation, Joint mobilization, Canalith repositioning, Aquatic Therapy, Dry Needling, Electrical stimulation, Spinal manipulation, Spinal mobilization, Cryotherapy, Moist heat, Taping, Traction, Ultrasound, Ionotophoresis 4mg /ml Dexamethasone, and Manual therapy  PLAN FOR NEXT SESSION: Review/modify HEP as needed, progress LLE strengthening;   Lyndel Safe Jamarkis Branam PT, DPT, GCS  Amarionna Arca 04/24/2022, 9:47 PM

## 2022-04-28 ENCOUNTER — Ambulatory Visit: Payer: 59

## 2022-04-28 DIAGNOSIS — G8929 Other chronic pain: Secondary | ICD-10-CM

## 2022-04-28 DIAGNOSIS — M6281 Muscle weakness (generalized): Secondary | ICD-10-CM

## 2022-04-30 ENCOUNTER — Ambulatory Visit: Payer: 59

## 2022-05-05 ENCOUNTER — Ambulatory Visit: Payer: 59 | Attending: Family Medicine

## 2022-05-05 DIAGNOSIS — M6281 Muscle weakness (generalized): Secondary | ICD-10-CM | POA: Diagnosis present

## 2022-05-05 DIAGNOSIS — R2689 Other abnormalities of gait and mobility: Secondary | ICD-10-CM | POA: Diagnosis present

## 2022-05-05 DIAGNOSIS — M25562 Pain in left knee: Secondary | ICD-10-CM | POA: Diagnosis not present

## 2022-05-05 DIAGNOSIS — G8929 Other chronic pain: Secondary | ICD-10-CM

## 2022-05-05 NOTE — Therapy (Signed)
OUTPATIENT PHYSICAL THERAPY KNEE TREATMENT  Patient Name: Kimbly Keville MRN: [id number] DOB:1950/06/01, 72 y.o., female Today's Date: 05/06/2022   PT End of Session - 05/05/22 1227     Visit Number 6    Number of Visits 13    Date for PT Re-Evaluation 06/02/22    Authorization Type eval: 04/07/22    PT Start Time 1145    PT Stop Time 1230    PT Time Calculation (min) 45 min    Activity Tolerance Patient tolerated treatment well    Behavior During Therapy [facility] for tasks assessed/performed            Past Medical History:  Diagnosis Date   Hyperlipidemia    Hypertension    Past Surgical History:  Procedure Laterality Date   NO PAST SURGERIES     Patient Active Problem List   Diagnosis Date Noted   DM (diabetes mellitus), type 2 09/24/2021   GERD (gastroesophageal reflux disease) 09/24/2021   Aortic atherosclerosis 06/19/2021   Pulmonary emphysema 06/19/2021   Mixed incontinence 03/10/2021   Class 1 obesity due to excess calories with body mass index (BMI) of 31.0 to 31.9 in adult 02/13/2021   Osteoarthritis of left knee 06/18/2020   Obstructive sleep apnea 07/18/2019   Hyperlipidemia 06/21/2019   Hypertension 05/23/2019   PCP: Louis Matte, MD  REFERRING PROVIDER: Jerrol Banana, MD  REFERRING DIAGNOSIS: M17.12 (ICD-10-CM) - Osteoarthritis of left knee, unspecified osteoarthritis type   THERAPY DIAG: Chronic pain of left knee  Muscle weakness (generalized)  RATIONALE FOR EVALUATION AND TREATMENT: Rehabilitation  ONSET DATE: 03/2020 (approximate)  FOLLOW UP APPT WITH PROVIDER: Yes, 06/16/22  FROM INITIAL EVALUATION (04/07/22) SUBJECTIVE:                                                                                                                                                                                         Chief Complaint: Chronic L knee pain;  Pertinent History Acute on chronic left knee pain in the setting of osteoarthritis.  Previously seen in physical therapy for this issue in 2023.  Underwent Zilretta injection in L knee on 03/18/22 and was started on a brief course of Celebrex. At the time of PT evaluation L knee pain has resolved. Pt would like to work on strengthening in order to prevent pain from returning.  History from PT evaluation 03/28/21: Pt has history of L knee pain that started ~1 year ago and began gradually getting worse. ~6 weeks ago pain got so intense that the throbbing would wake her up from her sleep in the middle of the night. Went to go see her GP who  referred her to Dr. Ashley Royalty who gave her an injection in her knee on 02/24/21. Since the injection her knee pain has significantly decreased, but she has been experiencing some buckling in her knee. She reports no falls with the buckling. Pt reports still occasionally feeling the pain in her L knee, but no where near as bad as it had been before the injection. Prior to the injection the pt stated that she was an entirely different person due to the amount of pain she was in. Prior to the injection the pain at its worst would reach a 10/10 on the NPRS. Pt reports the pain was constant and throbbing. Pt stated that in the mornings her knee would be stiff and painful, but after she took a hot shower and started moving it would ease up, then after some activity the pain would increase again. To ease the pain the pt mainly just tried to stay off of the leg. Pt stated that when she did have to walk she "walked like a rocking chair". Pt still can not walk for long durations, but she reports that it is more due to fatigue than pain. Pt has no previous back, hip, knee or ankle injuries that she can recall. Pt describes herself as sedentary and does no perform much physical activity. Pt enjoys reading and would like to be able to walk around [place] and the grocery store. Pt lives in a single level home with no stairs.  Pain:  Pain Intensity: Present: 0/10, Best: 0/10,  Worst: 0/10 (no pain s/p injection); Pain location: No pain currently but prior to injection generalized L knee pain; Pain quality: aching and throbbing   Radiating pain: No  Swelling: No  Popping, catching, locking: Yes, was catching prior to injection Numbness/Tingling: No Focal weakness or buckling: Yes, occasional buckling Aggravating factors: extended sitting and standing, "squatting is not even an option" Relieving factors: Zilretta injection, Voltaren  24-hour pain behavior: Varied based on activity History of prior back, hip, or knee injury, pain, surgery, or therapy: No Falls: Has patient fallen in last 6 months? No, Dominant hand: right Imaging: Yes, IMPRESSION: 1. Tricompartmental osteoarthritis, most prominently affecting the patellofemoral compartment. 2. Valgus angulation. Lateral patellar tilt. Prior level of function: Independent Occupational demands: Retired Presenter, broadcasting: Reading Red flags: Negative for chills/fever, night sweats, nausea, vomiting, unrelenting pain;  Precautions: None  Weight Bearing Restrictions: No  Living Environment Lives with: lives alone Lives in: House/apartment, no steps; uses a cane occasionally when knee is hurting  Patient Goals: Prevent L knee pain from returning and strengthen her LLE;  OBJECTIVE:   Patient Surveys  FOTO 51, predicted improvement to 61  Cognition Patient is oriented to person, place, and time.  Recent memory is intact.  Remote memory is intact.  Attention span and concentration are intact.  Expressive speech is intact.  Patient's fund of knowledge is within normal limits for educational level.    Gross Musculoskeletal Assessment Tremor: None Bulk: Normal Tone: Normal No visible step-off along spinal column, no signs of scoliosis  GAIT: Mildly antalgic gait noted with varus angulation noted in L knee. Decreased self-selected gait speed.  Assistive device utilized: None Level of assistance: Complete  Independence  AROM AROM (Normal range in degrees) AROM  04/07/2022  Hip Right Left  Flexion (125) WNL WNL  Extension (15) WNL WNL  Abduction (40)    Adduction     Internal Rotation (45) WNL WNL  External Rotation (45) WNL WNL  Knee    Flexion (135) WNL WNL* (pain with overpressure)  Extension (0) WNL WNL* (pain with overpressure)  (* = pain; Blank rows = not tested)  LE MMT: MMT (out of 5) Right 04/07/2022 Left 04/07/2022  Hip flexion 4+ 4  Hip extension 3+ 3+  Hip abduction 3+ 3+  Hip adduction 4 4  Hip internal rotation    Hip external rotation    Knee flexion 4 3+  Knee extension 5 4*  Ankle dorsiflexion 5 4* (pain in knee)  (* = pain; Blank rows = not tested)  Muscle Length Hamstrings: R: Not examined L: Negative (at least 90 degrees) Quadriceps Michela Pitcher): R: Shortened L: Shortened  Palpation Location LEFT  RIGHT           Quadriceps 0   Medial Hamstrings 0   Lateral Hamstrings 0   Lateral Hamstring tendon 0   Medial Hamstring tendon 0   Quadriceps tendon 0   Patella 0   Patellar Tendon 1   Tibial Tuberosity 0   Medial joint line 2   Lateral joint line 0   MCL 2   LCL 0   Adductor Tubercle 0   Pes Anserine tendon 1   Infrapatellar fat pad    Fibular head 0   Popliteal fossa 0   (Blank rows = not tested) Graded on 0-4 scale (0 = no pain, 1 = pain, 2 = pain with wincing/grimacing/flinching, 3 = pain with withdrawal, 4 = unwilling to allow palpation), (Blank rows = not tested)  VASCULAR Dorsalis pedis and posterior tibial pulses are palpable  SPECIAL TESTS  Ligamentous Stability  ACL: Lachman's: R: Not examined L: Negative Anterior Drawer: R: Not examined L: Negative  PCL: Posterior Drawer: R: Not examined L: Negative Reverse Lachman's: R: Not examined L: Negative Posterior Sag Sign: R: Not examined L: Negative  MCL: Valgus Stress (30 degrees flexion): R: Not examined L: Negative  LCL: Varus Stress (30 degrees flexion): R: Not examined L:  Negative  Meniscus Tests McMurray's Test:  Medial Meniscus (Tibial ER): R: Not examined L: Negative Lateral Meniscus (Tibial IR): R: Not examined L: Negative  Thessaly: R: Not examined L: Not examined Ege's: R: Not examined L: Not examined  Patellofemoral Pain Syndrome Patellar Tilt (Lateral): R: Not tested L: Negative Patellar Apprehension: R: Not tested L: Positive Squatting pain: R: Not examined L: Positive Stair climbing pain: R: Not examined L: Not examined Kneeling pain: R: Not examined L: Not examined Resisted knee extension pain: R: Not examined L: Positive Compression: R: Not examined L: Positive Clarke's sign: R: Not examined L: Positive Lateral Pull: R: Not examined L: Not examined  Patellar Tendinopathy Inferior pole palpation with anterior tilt: R: Not examined L: Negative     TODAY'S TREATMENT    SUBJECTIVE: Denies L knee pain upon arrival but has had intermittent pain since the last therapy session. No specific questions or concerns currently.   PAIN: Denies resting pain but reports pain during ambulation;   Ther-ex  NuStep L1-4 x 10 minutes for LE strengthening, warm-up, and while obtaining interval history, therapist adjusting resistance (5 minutes unbilled); Attempted mini squats but too painful so discontinued; Seated LAQ with 5# AW 2 x 15;  Standing exercises with 5# ankle weight (AW): Hip flexion marches  x 15 BLE; Hamstring curls  x 15 BLE; Hip abduction  x 15 BLE; Hip extension  x 15 BLE;  Supine L SLR with 3# ankle weight (AW) 2 x 15; Hooklying clams with manual  resistance 2 x 10; Seated adductor squeeze with manual resistnace 3s hold 2 x 10; Hooklying bridges 2 x 10;   Not performed: Total Gym (TG) Level 22 (L22) double leg squats x 10, due to increase in knee pain ; TG L22 heel raises x 10; Supine L quad set over towel roll 5s hold x 10; R sidelying L hip abduction with 3# AW 3 x 10; R sidelying L hip reverese clam with 3# AW 3 x  10; Prone L hamstring curls with 3# AW 3 x 10; Prone L hip extension with 3# AW 3 x 10; Supine L SAQ over bolster with heavy manual resistance 2 x 10; 6" forward step-ups leading with LUE up/RLE down  x 10; 6" L lateral step-ups x 10; Green tband resisted side stepping (band around knees) x 4 lengths;   PATIENT EDUCATION:  Education details: Pt educated throughout session about proper posture and technique with exercises. Improved exercise technique, movement at target joints, use of target muscles after min to mod verbal, visual, tactile cues.  Person educated: Patient Education method: Explanation, verbal cues Education comprehension: verbalized understanding and returned demonstration;   HOME EXERCISE PROGRAM: Access Code: [email or web address] URL: [email or web address] Date: 04/07/2022 Prepared by: Ria Comment  Exercises - Supine Quad Set  - 1 x daily - 7 x weekly - 2-3 sets - 10 reps - 3s hold - Supine Active Straight Leg Raise (Mirrored)  - 1 x daily - 7 x weekly - 2-3 sets - 10 reps - 3s hold - Bridge with Hip Abduction and Resistance  - 1 x daily - 7 x weekly - 2-3 sets - 10 reps - 3s hold - Sidelying Hip Abduction (Mirrored)  - 1 x daily - 7 x weekly - 2-3 sets - 10 reps - 3s hold   ASSESSMENT:  CLINICAL IMPRESSION: Progressed strengthening with patient during session today. Plan to progress strengthening during future sessions in more functional standing positions as pain will allow. Overall her pain appears to be worsening during the last couple sessions as she gets farther out from her steroid injection. Will progress HEP once able. Plan to decrease frequency to 1x/wk moving forward. Pt will benefit from PT services to address deficits in strength, pain, and mobility in order to return to full function at home with less pain.  OBJECTIVE IMPAIRMENTS: Abnormal gait, difficulty walking, and pain.   ACTIVITY LIMITATIONS: sitting, standing, squatting, and walking  extended distances  PARTICIPATION LIMITATIONS: community activity  PERSONAL FACTORS: Age, Past/current experiences, Time since onset of injury/illness/exacerbation, and 3+ comorbidities: OA, DM, and emphysema are also affecting patient's functional outcome.   REHAB POTENTIAL: Excellent  CLINICAL DECISION MAKING: Stable/uncomplicated  EVALUATION COMPLEXITY: Low   GOALS: Goals reviewed with patient? Yes  SHORT TERM GOALS: Target date: 04/28/2022  Pt will be independent with HEP to improve strength and decrease knee pain to improve pain-free function at home Baseline:  Goal status: INITIAL   LONG TERM GOALS: Target date: 05/19/2022  Pt will increase FOTO to at least 58 to demonstrate significant improvement in function at home and work related to knee pain  Baseline: 04/07/22: 51; Goal status: INITIAL  2.  Pt will increase strength of pain-free L knee extension and L hip abduction by at least 1/2 MMT grade in order to demonstrate improvement in strength and function  Baseline: 04/07/22: L knee extension: 4/5, painful; L hip abduction: 3+/5, no pain; Goal status: INITIAL  3.  Pt will report at least 75% improvement in her  L knee symptoms in order demonstrate clinically significant reduction in knee pain/disability.       Baseline:  Goal status: INITIAL   PLAN: PT FREQUENCY: 1-2x/week  PT DURATION: 6 weeks  PLANNED INTERVENTIONS: Therapeutic exercises, Therapeutic activity, Neuromuscular re-education, Balance training, Gait training, Patient/Family education, Joint manipulation, Joint mobilization, Canalith repositioning, Aquatic Therapy, Dry Needling, Electrical stimulation, Spinal manipulation, Spinal mobilization, Cryotherapy, Moist heat, Taping, Traction, Ultrasound, Ionotophoresis 4mg /ml Dexamethasone, and Manual therapy  PLAN FOR NEXT SESSION: Review/modify HEP as needed, progress LLE strengthening;   Sharalyn Ink Vannah Nadal PT, DPT, GCS  Winslow Ederer 05/06/2022, 10:39 AM

## 2022-05-07 ENCOUNTER — Ambulatory Visit: Payer: 59

## 2022-05-14 ENCOUNTER — Ambulatory Visit: Payer: 59

## 2022-05-18 NOTE — Therapy (Signed)
OUTPATIENT PHYSICAL THERAPY KNEE TREATMENT/DISCHARGE  Patient Name: Darlene Mcdaniel MRN: [id number] DOB:December 02, 1950, 72 y.o., female Today's Date: 05/19/2022   PT End of Session - 05/19/22 1110     Visit Number 7    Number of Visits 13    Date for PT Re-Evaluation 05/19/22    Authorization Type eval: 04/07/22    PT Start Time 1107    PT Stop Time 1147    PT Time Calculation (min) 40 min    Activity Tolerance Patient tolerated treatment well    Behavior During Therapy [facility] for tasks assessed/performed            Past Medical History:  Diagnosis Date   Hyperlipidemia    Hypertension    Past Surgical History:  Procedure Laterality Date   NO PAST SURGERIES     Patient Active Problem List   Diagnosis Date Noted   DM (diabetes mellitus), type 2 09/24/2021   GERD (gastroesophageal reflux disease) 09/24/2021   Aortic atherosclerosis 06/19/2021   Pulmonary emphysema 06/19/2021   Mixed incontinence 03/10/2021   Class 1 obesity due to excess calories with body mass index (BMI) of 31.0 to 31.9 in adult 02/13/2021   Osteoarthritis of left knee 06/18/2020   Obstructive sleep apnea 07/18/2019   Hyperlipidemia 06/21/2019   Hypertension 05/23/2019   PCP: Louis Matte, MD  REFERRING PROVIDER: Jerrol Banana, MD  REFERRING DIAGNOSIS: M17.12 (ICD-10-CM) - Osteoarthritis of left knee, unspecified osteoarthritis type   THERAPY DIAG: Chronic pain of left knee  Muscle weakness (generalized)  Other abnormalities of gait and mobility  RATIONALE FOR EVALUATION AND TREATMENT: Rehabilitation  ONSET DATE: 03/2020 (approximate)  FOLLOW UP APPT WITH PROVIDER: Yes, 06/16/22  FROM INITIAL EVALUATION (04/07/22) SUBJECTIVE:                                                                                                                                                                                         Chief Complaint: Chronic L knee pain;  Pertinent History Acute on chronic  left knee pain in the setting of osteoarthritis. Previously seen in physical therapy for this issue in 2023.  Underwent Zilretta injection in L knee on 03/18/22 and was started on a brief course of Celebrex. At the time of PT evaluation L knee pain has resolved. Pt would like to work on strengthening in order to prevent pain from returning.  History from PT evaluation 03/28/21: Pt has history of L knee pain that started ~1 year ago and began gradually getting worse. ~6 weeks ago pain got so intense that the throbbing would wake her up from her sleep in the middle of the night.  Went to go see her GP who referred her to Dr. Ashley Royalty who gave her an injection in her knee on 02/24/21. Since the injection her knee pain has significantly decreased, but she has been experiencing some buckling in her knee. She reports no falls with the buckling. Pt reports still occasionally feeling the pain in her L knee, but no where near as bad as it had been before the injection. Prior to the injection the pt stated that she was an entirely different person due to the amount of pain she was in. Prior to the injection the pain at its worst would reach a 10/10 on the NPRS. Pt reports the pain was constant and throbbing. Pt stated that in the mornings her knee would be stiff and painful, but after she took a hot shower and started moving it would ease up, then after some activity the pain would increase again. To ease the pain the pt mainly just tried to stay off of the leg. Pt stated that when she did have to walk she "walked like a rocking chair". Pt still can not walk for long durations, but she reports that it is more due to fatigue than pain. Pt has no previous back, hip, knee or ankle injuries that she can recall. Pt describes herself as sedentary and does no perform much physical activity. Pt enjoys reading and would like to be able to walk around [place] and the grocery store. Pt lives in a single level home with no stairs.  Pain:   Pain Intensity: Present: 0/10, Best: 0/10, Worst: 0/10 (no pain s/p injection); Pain location: No pain currently but prior to injection generalized L knee pain; Pain quality: aching and throbbing   Radiating pain: No  Swelling: No  Popping, catching, locking: Yes, was catching prior to injection Numbness/Tingling: No Focal weakness or buckling: Yes, occasional buckling Aggravating factors: extended sitting and standing, "squatting is not even an option" Relieving factors: Zilretta injection, Voltaren  24-hour pain behavior: Varied based on activity History of prior back, hip, or knee injury, pain, surgery, or therapy: No Falls: Has patient fallen in last 6 months? No, Dominant hand: right Imaging: Yes, IMPRESSION: 1. Tricompartmental osteoarthritis, most prominently affecting the patellofemoral compartment. 2. Valgus angulation. Lateral patellar tilt. Prior level of function: Independent Occupational demands: Retired Presenter, broadcasting: Reading Red flags: Negative for chills/fever, night sweats, nausea, vomiting, unrelenting pain;  Precautions: None  Weight Bearing Restrictions: No  Living Environment Lives with: lives alone Lives in: House/apartment, no steps; uses a cane occasionally when knee is hurting  Patient Goals: Prevent L knee pain from returning and strengthen her LLE;  OBJECTIVE:   Patient Surveys  FOTO 51, predicted improvement to 73  Cognition Patient is oriented to person, place, and time.  Recent memory is intact.  Remote memory is intact.  Attention span and concentration are intact.  Expressive speech is intact.  Patient's fund of knowledge is within normal limits for educational level.    Gross Musculoskeletal Assessment Tremor: None Bulk: Normal Tone: Normal No visible step-off along spinal column, no signs of scoliosis  GAIT: Mildly antalgic gait noted with varus angulation noted in L knee. Decreased self-selected gait speed.  Assistive device utilized:  None Level of assistance: Complete Independence  AROM AROM (Normal range in degrees) AROM  04/07/2022  Hip Right Left  Flexion (125) WNL WNL  Extension (15) WNL WNL  Abduction (40)    Adduction     Internal Rotation (45) WNL WNL  External Rotation (45)  WNL WNL      Knee    Flexion (135) WNL WNL* (pain with overpressure)  Extension (0) WNL WNL* (pain with overpressure)  (* = pain; Blank rows = not tested)  LE MMT: MMT (out of 5) Right 04/07/2022 Left 04/07/2022  Hip flexion 4+ 4  Hip extension 3+ 3+  Hip abduction 3+ 3+  Hip adduction 4 4  Hip internal rotation    Hip external rotation    Knee flexion 4 3+  Knee extension 5 4*  Ankle dorsiflexion 5 4* (pain in knee)  (* = pain; Blank rows = not tested)  Muscle Length Hamstrings: R: Not examined L: Negative (at least 90 degrees) Quadriceps Michela Pitcher): R: Shortened L: Shortened  Palpation Location LEFT  RIGHT           Quadriceps 0   Medial Hamstrings 0   Lateral Hamstrings 0   Lateral Hamstring tendon 0   Medial Hamstring tendon 0   Quadriceps tendon 0   Patella 0   Patellar Tendon 1   Tibial Tuberosity 0   Medial joint line 2   Lateral joint line 0   MCL 2   LCL 0   Adductor Tubercle 0   Pes Anserine tendon 1   Infrapatellar fat pad    Fibular head 0   Popliteal fossa 0   (Blank rows = not tested) Graded on 0-4 scale (0 = no pain, 1 = pain, 2 = pain with wincing/grimacing/flinching, 3 = pain with withdrawal, 4 = unwilling to allow palpation), (Blank rows = not tested)  VASCULAR Dorsalis pedis and posterior tibial pulses are palpable  SPECIAL TESTS  Ligamentous Stability  ACL: Lachman's: R: Not examined L: Negative Anterior Drawer: R: Not examined L: Negative  PCL: Posterior Drawer: R: Not examined L: Negative Reverse Lachman's: R: Not examined L: Negative Posterior Sag Sign: R: Not examined L: Negative  MCL: Valgus Stress (30 degrees flexion): R: Not examined L: Negative  LCL: Varus Stress (30  degrees flexion): R: Not examined L: Negative  Meniscus Tests McMurray's Test:  Medial Meniscus (Tibial ER): R: Not examined L: Negative Lateral Meniscus (Tibial IR): R: Not examined L: Negative  Thessaly: R: Not examined L: Not examined Ege's: R: Not examined L: Not examined  Patellofemoral Pain Syndrome Patellar Tilt (Lateral): R: Not tested L: Negative Patellar Apprehension: R: Not tested L: Positive Squatting pain: R: Not examined L: Positive Stair climbing pain: R: Not examined L: Not examined Kneeling pain: R: Not examined L: Not examined Resisted knee extension pain: R: Not examined L: Positive Compression: R: Not examined L: Positive Clarke's sign: R: Not examined L: Positive Lateral Pull: R: Not examined L: Not examined  Patellar Tendinopathy Inferior pole palpation with anterior tilt: R: Not examined L: Negative     TODAY'S TREATMENT    SUBJECTIVE: Fell off the bed last night rolling over but did not injure herself. She arrives reporting soreness from vomiting secondary to a recent GI illness. Denies resting L knee pain upon arrival but continued L knee pain during ambulation. No specific questions or concerns currently.   PAIN: Denies resting pain but reports 5/10 L knee pain during ambulation;   Ther-ex  NuStep L1-4 x 6 minutes for LE strengthening, warm-up, and while obtaining interval history, therapist adjusting resistance; Updated outcome measures with patient (see goal section);  R sidelying L hip abduction with 3# ankle weight (AW) 2 x 10; R sidelying clams with manual resistance 2 x 10; R sidelying L hip  reverse clams with 3# AW 2 x 10; Supine L SLR with 3# ankle weight (AW) 2 x 10; Hooklying bridges 2 x 10; Supine L SAQ over bolster with heavy manual resistance 2 x 10; Supine L heel slides with manually resisted extension x 10; Discussed discharge;   PATIENT EDUCATION:  Education details: Pt educated throughout session about proper posture and  technique with exercises. Improved exercise technique, movement at target joints, use of target muscles after min to mod verbal, visual, tactile cues. Discharge Person educated: Patient Education method: Explanation, verbal cues Education comprehension: verbalized understanding and returned demonstration;   HOME EXERCISE PROGRAM: Access Code: [email or web address] URL: [email or web address] Date: 04/07/2022 Prepared by: Ria Comment  Exercises - Supine Quad Set  - 1 x daily - 7 x weekly - 2-3 sets - 10 reps - 3s hold - Supine Active Straight Leg Raise (Mirrored)  - 1 x daily - 7 x weekly - 2-3 sets - 10 reps - 3s hold - Bridge with Hip Abduction and Resistance  - 1 x daily - 7 x weekly - 2-3 sets - 10 reps - 3s hold - Sidelying Hip Abduction (Mirrored)  - 1 x daily - 7 x weekly - 2-3 sets - 10 reps - 3s hold   ASSESSMENT:  CLINICAL IMPRESSION: Updated outcome measures and goals with patient during visit today. She denies any improvement in pain since starting therapy and in fact the farther out from her steroid injection the worse her pain appears. Her FOTO score worsened however she did make gains in L hip abduction and knee extension strength. Progressed strengthening with patient during session today. Pt encouraged to continue her HEP. She will be discharged at this time with instructions to follow-up with Dr. Ashley Royalty for her next injection.    OBJECTIVE IMPAIRMENTS: Abnormal gait, difficulty walking, and pain.   ACTIVITY LIMITATIONS: sitting, standing, squatting, and walking extended distances  PARTICIPATION LIMITATIONS: community activity  PERSONAL FACTORS: Age, Past/current experiences, Time since onset of injury/illness/exacerbation, and 3+ comorbidities: OA, DM, and emphysema are also affecting patient's functional outcome.   REHAB POTENTIAL: Excellent  CLINICAL DECISION MAKING: Stable/uncomplicated  EVALUATION COMPLEXITY: Low   GOALS: Goals reviewed with patient?  Yes  SHORT TERM GOALS: Target date: 04/28/2022  Pt will be independent with HEP to improve strength and decrease knee pain to improve pain-free function at home Baseline:  Goal status: ACHIEVED   LONG TERM GOALS: Target date: 05/19/2022  Pt will increase FOTO to at least 58 to demonstrate significant improvement in function at home and work related to knee pain  Baseline: 04/07/22: 51; 05/19/22: 41 Goal status: NOT MET  2.  Pt will increase strength of pain-free L knee extension and L hip abduction by at least 1/2 MMT grade in order to demonstrate improvement in strength and function  Baseline: 04/07/22: L knee extension: 4/5, painful; L hip abduction: 3+/5, no pain; 05/19/22: L knee extension: 5/5, no pain; L hip abduction: 4-/5, no pain; Goal status: PARTIALLY MET  3.  Pt will report at least 75% improvement in her L knee symptoms in order demonstrate clinically significant reduction in knee pain/disability.       Baseline: 05/19/22: unchanged; Goal status: NOT MET   PLAN: PT FREQUENCY: 1-2x/week  PT DURATION: 6 weeks  PLANNED INTERVENTIONS: Therapeutic exercises, Therapeutic activity, Neuromuscular re-education, Balance training, Gait training, Patient/Family education, Joint manipulation, Joint mobilization, Canalith repositioning, Aquatic Therapy, Dry Needling, Electrical stimulation, Spinal manipulation, Spinal mobilization, Cryotherapy, Moist heat, Taping, Traction, Ultrasound, Ionotophoresis /ml Dexamethasone,  and Manual therapy  PLAN FOR NEXT SESSION: Discharge   Sharalyn Ink Logann Whitebread PT, DPT, [facility]  Meena Barrantes 05/19/2022, 1:01 PM

## 2022-05-19 ENCOUNTER — Ambulatory Visit: Payer: 59

## 2022-05-19 DIAGNOSIS — G8929 Other chronic pain: Secondary | ICD-10-CM

## 2022-05-19 DIAGNOSIS — M25562 Pain in left knee: Secondary | ICD-10-CM | POA: Diagnosis not present

## 2022-05-19 DIAGNOSIS — R2689 Other abnormalities of gait and mobility: Secondary | ICD-10-CM

## 2022-05-19 DIAGNOSIS — M6281 Muscle weakness (generalized): Secondary | ICD-10-CM

## 2022-06-16 ENCOUNTER — Encounter: Payer: Self-pay | Admitting: Family Medicine

## 2022-06-16 ENCOUNTER — Ambulatory Visit (INDEPENDENT_AMBULATORY_CARE_PROVIDER_SITE_OTHER): Payer: 59 | Admitting: Family Medicine

## 2022-06-16 VITALS — BP 118/78 | HR 80 | Ht 60.0 in | Wt 150.0 lb

## 2022-06-16 DIAGNOSIS — M1712 Unilateral primary osteoarthritis, left knee: Secondary | ICD-10-CM

## 2022-06-16 MED ORDER — CELECOXIB 100 MG PO CAPS: ORAL_CAPSULE | ORAL | 0 refills | Status: AC

## 2022-06-16 MED ORDER — DULOXETINE HCL 60 MG PO CPEP: 60.0000 mg | ORAL_CAPSULE | Freq: Every day | ORAL | 2 refills | Status: AC

## 2022-06-16 MED ORDER — DULOXETINE HCL 30 MG PO CPEP: ORAL_CAPSULE | ORAL | 0 refills | Status: AC

## 2022-06-16 MED ORDER — TRAMADOL HCL 50 MG PO TABS: 50.0000 mg | ORAL_TABLET | Freq: Four times a day (QID) | ORAL | 0 refills | Status: AC | PRN

## 2022-06-16 NOTE — Patient Instructions (Signed)
-   Take Celebrex twice daily as needed for pain (anti-inflammatory) - Take tramadol for severe breakthrough pain (opioid) - Start duloxetine (Cymbalta) in the following stepwise manner: Week 1: Take 1 capsule (30 mg) Week 2: Take 2 capsules (60 mg) until Rx complete Contact us/pick up new Rx of single 60 mg capsule and dose daily - Referral coordinator will contact you to schedule visit with orthopedic surgeon - Contact us for any questions/concerns, follow-up as needed

## 2022-06-22 NOTE — Assessment & Plan Note (Signed)
Acute on chronic left knee arthralgia in the setting of osteoarthritis.  Did have Zilretta injection 03/18/2022 with modest response.  As such, discussed both additional nonsurgical and surgical options.  Plan as follows: - Take Celebrex twice daily as needed for pain (anti-inflammatory) - Take tramadol for severe breakthrough pain (opioid) - Start duloxetine (Cymbalta) in the following stepwise manner: Week 1: Take 1 capsule (30 mg) Week 2: Take 2 capsules (60 mg) until Rx complete Contact us/pick up new Rx of single 60 mg capsule and dose daily - Referral coordinator will contact you to schedule visit with orthopedic surgeon - Contact us for any questions/concerns, follow-up as needed

## 2022-06-22 NOTE — Progress Notes (Signed)
Primary Care / Sports Medicine Office Visit  Patient Information:  Patient ID: Darlene Mcdaniel, female DOB: [birth year] Age: 72 y.o. MRN: [id number]   Darlene Mcdaniel is a pleasant 72 y.o. female presenting with the following:  Chief Complaint  Patient presents with   Knee Pain    Lasted about 1 month    Vitals:   06/16/22 1013  BP: 118/78  Pulse: 80  SpO2: 98%   Vitals:   06/16/22 1013  Weight: 150 lb (68 kg)  Height: 5' (1.524 m)   Body mass index is 29.29 kg/m.  No results found.   Independent interpretation of notes and tests performed by another provider:   None  Procedures performed:   None  Pertinent History, Exam, Impression, and Recommendations:   Darlene Mcdaniel was seen today for knee pain.  Osteoarthritis of left knee, unspecified osteoarthritis type Overview: Zilretta injection: - 03/18/2022  Assessment & Plan: Acute on chronic left knee arthralgia in the setting of osteoarthritis.  Did have Zilretta injection 03/18/2022 with modest response.  As such, discussed both additional nonsurgical and surgical options.  Plan as follows: - Take Celebrex twice daily as needed for pain (anti-inflammatory) - Take tramadol for severe breakthrough pain (opioid) - Start duloxetine (Cymbalta) in the following stepwise manner: Week 1: Take 1 capsule (30 mg) Week 2: Take 2 capsules (60 mg) until Rx complete Contact us/pick up new Rx of single 60 mg capsule and dose daily - Referral coordinator will contact you to schedule visit with orthopedic surgeon - Contact us for any questions/concerns, follow-up as needed  Orders: -     traMADol HCl; Take 1 tablet (50 mg total) by mouth every 6 (six) hours as needed for up to 5 days for severe pain.  Dispense: 20 tablet; Refill: 0 -     DULoxetine HCl; Take 1 capsule (30 mg total) by mouth every evening for 7 days, THEN 2 capsules (60 mg total) every evening for 23 days. CONTACT FOR REFILL at 60 mg AFTER.  Dispense: 53 capsule;  Refill: 0 -     DULoxetine HCl; Take 1 capsule (60 mg total) by mouth daily.  Dispense: 90 capsule; Refill: 2 -     Celecoxib; TAKE 1 CAPSULE(100 MG) BY MOUTH TWICE DAILY AS NEEDED  Dispense: 60 capsule; Refill: 0 -     Ambulatory referral to Orthopedic Surgery     Orders & Medications Meds ordered this encounter  Medications   traMADol (ULTRAM) 50 MG tablet    Sig: Take 1 tablet (50 mg total) by mouth every 6 (six) hours as needed for up to 5 days for severe pain.    Dispense:  20 tablet    Refill:  0   DULoxetine (CYMBALTA) 30 MG capsule    Sig: Take 1 capsule (30 mg total) by mouth every evening for 7 days, THEN 2 capsules (60 mg total) every evening for 23 days. CONTACT FOR REFILL at 60 mg AFTER.    Dispense:  53 capsule    Refill:  0   DULoxetine (CYMBALTA) 60 MG capsule    Sig: Take 1 capsule (60 mg total) by mouth daily.    Dispense:  90 capsule    Refill:  2   celecoxib (CELEBREX) 100 MG capsule    Sig: TAKE 1 CAPSULE(100 MG) BY MOUTH TWICE DAILY AS NEEDED    Dispense:  60 capsule    Refill:  0   Orders Placed This Encounter  Procedures   Ambulatory referral  to Orthopedic Surgery     No follow-ups on file.     Jerrol Banana, MD, [facility]   Primary Care Sports Medicine Primary Care and Sports Medicine at [facility]

## 2022-06-24 ENCOUNTER — Ambulatory Visit
Admission: RE | Admit: 2022-06-24 | Discharge: 2022-06-24 | Disposition: A | Discharge status: Home / Self Care | Payer: 59 | Source: Ambulatory Visit | Attending: Internal Medicine | Admitting: Internal Medicine

## 2022-06-24 DIAGNOSIS — M8588 Other specified disorders of bone density and structure, other site: Secondary | ICD-10-CM | POA: Insufficient documentation

## 2022-06-24 DIAGNOSIS — Z780 Asymptomatic menopausal state: Secondary | ICD-10-CM | POA: Diagnosis not present

## 2022-06-24 DIAGNOSIS — Z13820 Encounter for screening for osteoporosis: Secondary | ICD-10-CM | POA: Diagnosis present

## 2022-06-24 DIAGNOSIS — E119 Type 2 diabetes mellitus without complications: Secondary | ICD-10-CM | POA: Insufficient documentation

## 2022-06-24 DIAGNOSIS — F17200 Nicotine dependence, unspecified, uncomplicated: Secondary | ICD-10-CM | POA: Insufficient documentation

## 2022-07-03 ENCOUNTER — Other Ambulatory Visit: Payer: Self-pay | Admitting: Acute Care

## 2022-07-03 DIAGNOSIS — Z87891 Personal history of nicotine dependence: Secondary | ICD-10-CM

## 2022-07-03 DIAGNOSIS — F17210 Nicotine dependence, cigarettes, uncomplicated: Secondary | ICD-10-CM

## 2022-07-03 DIAGNOSIS — Z122 Encounter for screening for malignant neoplasm of respiratory organs: Secondary | ICD-10-CM

## 2022-07-14 ENCOUNTER — Other Ambulatory Visit: Payer: Self-pay | Admitting: Family Medicine

## 2022-07-14 DIAGNOSIS — M1712 Unilateral primary osteoarthritis, left knee: Secondary | ICD-10-CM

## 2022-07-15 NOTE — Telephone Encounter (Signed)
Rx 06/16/22 #90 2RF- continuation dose on file Requested Prescriptions  Pending Prescriptions Disp Refills   DULoxetine (CYMBALTA) 30 MG capsule [Pharmacy Med Name: DULOXETINE DR 30MG  CAPSULES] 53 capsule 0    Sig: TAKE 1 CAPSULE( 30 MG) BY MOUTH EVERY EVENING FOR 7 DAYS, THEN 2 CAPSULES( 60 MG) EVERY EVENING FOR 23 DAYS. CONTACT FOR REFILL AT 60MG  AFTER     Psychiatry: Antidepressants - SNRI - duloxetine Passed - 07/14/2022  3:17 AM      Passed - Cr in normal range and within 360 days    Creat  Date Value Ref Range Status  12/29/2021 0.84 0.50 - 1.05 mg/dL Final   Creatinine, Urine  Date Value Ref Range Status  09/24/2021 80 20 - 275 mg/dL Final         Passed - eGFR is 30 or above and within 360 days    GFR, Est African American  Date Value Ref Range Status  05/23/2019 113 > OR = 60 mL/min/1.41m2 Final   GFR, Est Non African American  Date Value Ref Range Status  05/23/2019 98 > OR = 60 mL/min/1.75m2 Final   eGFR  Date Value Ref Range Status  12/29/2021 75 > OR = 60 mL/min/1.42m2 Final         Passed - Completed PHQ-2 or PHQ-9 in the last 360 days      Passed - Last BP in normal range    BP Readings from Last 1 Encounters:  06/16/22 118/78         Passed - Valid encounter within last 6 months    Recent Outpatient Visits           4 weeks ago Osteoarthritis of left knee, unspecified osteoarthritis type   [place] [facility] Ashley Royalty, Ocie Bob, MD   3 months ago Osteoarthritis of left knee, unspecified osteoarthritis type   [facility] at [facility], Ocie Bob, MD   6 months ago Type 2 diabetes mellitus with stage 3a chronic kidney disease, without long-term current use of insulin [facility])   [place] [facility] [place], [place] W, NP   9 months ago Type 2 diabetes mellitus with stage 3a chronic kidney disease, without long-term current use of insulin [facility])   [place]  [facility] [place], Salvadore Oxford, NP   1 year ago Encounter for general adult medical examination with abnormal findings   [place] [facility] [place], Salvadore Oxford, NP

## 2022-08-18 ENCOUNTER — Encounter: Payer: Self-pay | Admitting: Podiatry

## 2022-08-18 ENCOUNTER — Ambulatory Visit (INDEPENDENT_AMBULATORY_CARE_PROVIDER_SITE_OTHER): Payer: 59 | Admitting: Podiatry

## 2022-08-18 ENCOUNTER — Ambulatory Visit (INDEPENDENT_AMBULATORY_CARE_PROVIDER_SITE_OTHER): Payer: 59

## 2022-08-18 VITALS — BP 103/60 | HR 68

## 2022-08-18 DIAGNOSIS — M2012 Hallux valgus (acquired), left foot: Secondary | ICD-10-CM

## 2022-08-18 DIAGNOSIS — M2011 Hallux valgus (acquired), right foot: Secondary | ICD-10-CM | POA: Diagnosis not present

## 2022-08-18 NOTE — Progress Notes (Signed)
   Chief Complaint  Patient presents with   Foot Pain    "This bone hurts." N - bone painful L - bunion right D - 1 month O - gradually worse C - tender, sore A - pressure, shoes T - I wear a bunion guard and cushions    Subjective:  72 y.o. female presenting today for evaluation of right foot pain.  Patient states that she has a symptomatic bunion that is very painful and tender.  Exacerbated over the last 6 months.  She is also being treated by orthopedics for left ankle arthritis.  Currently taking Celebrex prescribed by her orthopedist.   Past Medical History:  Diagnosis Date   Hyperlipidemia    Hypertension    Past Surgical History:  Procedure Laterality Date   NO PAST SURGERIES     No Known Allergies   B/L feet 08/18/2022   Objective/Physical Exam General: The patient is alert and oriented x3 in no acute distress.  Dermatology: Skin is warm, dry and supple bilateral lower extremities. Negative for open lesions or macerations.  Vascular: Palpable pedal pulses bilaterally.  Moderate edema noted RLE.  Capillary refill WNL  Neurological: Light touch and protective threshold grossly intact bilaterally.   Musculoskeletal Exam: Range of motion within normal limits to all pedal and ankle joints bilateral. Muscle strength 5/5 in all groups bilateral.  Upon weightbearing there is a medial longitudinal arch collapse right foot compared to the contralateral limb. Rearfoot valgus noted to the right lower extremity with excessive pronation upon mid stance. Hallux valgus also noted with a prominent first metatarsal head and lateral deviation of the great toe  Radiographic Exam RT foot 08/18/2022:  Normal osseous mineralization. Joint spaces preserved. No fracture/dislocation/boney destruction.  Pes planus noted on radiographic exam lateral views. Decreased calcaneal inclination and metatarsal declination angle is noted. Anterior break in the cyma line noted on lateral views. Medial  talar head to deviation noted on AP radiograph.  There is also increased intermetatarsal angle with an increased hallux abductus angle noted on AP view consistent with bunion deformity  Assessment: 1. PTTD/flatfoot RLE 2.  Hallux valgus RLE   Plan of Care:  -Patient was evaluated. X-Rays reviewed.  -Given the patient's severity of her deformity to the right foot, we did discuss surgery today.  Unfortunately patient has severe arthritic left knee and a hard time ambulating.  For now recommend conservative treatment including good supportive shoes and sneakers.  Advised against going barefoot. -Continue Celebrex as prescribed -Return to clinic as needed   Felecia Shelling, DPM [facility]  Dr. Felecia Shelling, DPM    [address]                                        [place], [place] [address]                Office [phone]  Fax [phone]

## 2022-09-08 ENCOUNTER — Ambulatory Visit: Payer: 59

## 2022-12-01 ENCOUNTER — Ambulatory Visit
Admission: RE | Admit: 2022-12-01 | Discharge: 2022-12-01 | Disposition: A | Discharge status: Home / Self Care | Payer: 59 | Source: Ambulatory Visit | Attending: Acute Care | Admitting: Acute Care

## 2022-12-01 DIAGNOSIS — F17210 Nicotine dependence, cigarettes, uncomplicated: Secondary | ICD-10-CM | POA: Insufficient documentation

## 2022-12-01 DIAGNOSIS — Z122 Encounter for screening for malignant neoplasm of respiratory organs: Secondary | ICD-10-CM | POA: Diagnosis present

## 2022-12-01 DIAGNOSIS — Z87891 Personal history of nicotine dependence: Secondary | ICD-10-CM | POA: Insufficient documentation

## 2022-12-28 ENCOUNTER — Other Ambulatory Visit: Payer: Self-pay

## 2022-12-28 DIAGNOSIS — Z87891 Personal history of nicotine dependence: Secondary | ICD-10-CM

## 2022-12-28 DIAGNOSIS — Z122 Encounter for screening for malignant neoplasm of respiratory organs: Secondary | ICD-10-CM

## 2022-12-28 DIAGNOSIS — F17210 Nicotine dependence, cigarettes, uncomplicated: Secondary | ICD-10-CM

## 2023-11-08 ENCOUNTER — Encounter: Payer: Self-pay | Admitting: Acute Care

## 2024-02-14 ENCOUNTER — Other Ambulatory Visit: Payer: Self-pay | Admitting: Internal Medicine

## 2024-02-14 DIAGNOSIS — Z122 Encounter for screening for malignant neoplasm of respiratory organs: Secondary | ICD-10-CM

## 2024-02-21 ENCOUNTER — Ambulatory Visit: Admission: RE | Admit: 2024-02-21 | Source: Ambulatory Visit
